# Patient Record
Sex: Female | Born: 1985 | Race: Black or African American | Hispanic: No | Marital: Single | Smoking: Current some day smoker
Health system: Southern US, Community
[De-identification: ages and names within clinical notes are randomized; demographics above are authoritative.]

---

## 2018-08-18 DIAGNOSIS — R0789 Other chest pain: Secondary | ICD-10-CM | POA: Insufficient documentation

## 2019-03-30 DIAGNOSIS — D219 Benign neoplasm of connective and other soft tissue, unspecified: Secondary | ICD-10-CM | POA: Insufficient documentation

## 2019-04-08 DIAGNOSIS — M549 Dorsalgia, unspecified: Secondary | ICD-10-CM | POA: Insufficient documentation

## 2019-04-08 DIAGNOSIS — Z5321 Procedure and treatment not carried out due to patient leaving prior to being seen by health care provider: Secondary | ICD-10-CM | POA: Insufficient documentation

## 2019-04-08 NOTE — ED Triage Notes (Signed)
Patient with complaint of lower back pain times 2 weeks ago. Patient states that she was seen at [facility] last Wednesday for the same. Patient states that she had a CT scan done at [facility] and she was told that it was muscular pain. Patient states that the pain has gotten worse.

## 2019-04-09 ENCOUNTER — Other Ambulatory Visit: Payer: Self-pay

## 2019-04-09 ENCOUNTER — Encounter: Payer: Self-pay | Admitting: Emergency Medicine

## 2019-04-09 ENCOUNTER — Emergency Department
Admission: EM | Admit: 2019-04-09 | Discharge: 2019-04-09 | Disposition: A | Discharge status: Home / Self Care | Payer: Self-pay | Attending: Emergency Medicine | Admitting: Emergency Medicine

## 2019-04-09 NOTE — ED Notes (Signed)
Unable to locate patient for update.

## 2019-04-09 NOTE — ED Notes (Signed)
Unable to locate patient to give update.

## 2019-04-19 DIAGNOSIS — M7918 Myalgia, other site: Secondary | ICD-10-CM | POA: Insufficient documentation

## 2019-04-19 DIAGNOSIS — M545 Low back pain, unspecified: Secondary | ICD-10-CM | POA: Insufficient documentation

## 2019-10-12 ENCOUNTER — Other Ambulatory Visit: Payer: Self-pay

## 2019-10-12 DIAGNOSIS — Z720 Tobacco use: Secondary | ICD-10-CM | POA: Insufficient documentation

## 2019-10-12 DIAGNOSIS — H60502 Unspecified acute noninfective otitis externa, left ear: Secondary | ICD-10-CM | POA: Insufficient documentation

## 2019-10-12 NOTE — ED Triage Notes (Signed)
Pt to the er for a right sided ear infection. Pt seen yesterday and given antibiotic drops. Pt states it is making it worse.

## 2019-10-13 ENCOUNTER — Emergency Department
Admission: EM | Admit: 2019-10-13 | Discharge: 2019-10-13 | Disposition: A | Discharge status: Home / Self Care | Payer: Self-pay | Attending: Emergency Medicine | Admitting: Emergency Medicine

## 2019-10-13 ENCOUNTER — Emergency Department: Payer: Self-pay

## 2019-10-13 DIAGNOSIS — H60502 Unspecified acute noninfective otitis externa, left ear: Secondary | ICD-10-CM

## 2019-10-13 DIAGNOSIS — H9202 Otalgia, left ear: Secondary | ICD-10-CM

## 2019-10-13 LAB — BASIC METABOLIC PANEL
Anion gap: 4 — ABNORMAL LOW (ref 5–15)
BUN: 19 mg/dL (ref 6–20)
CO2: 26 mmol/L (ref 22–32)
Calcium: 8.4 mg/dL — ABNORMAL LOW (ref 8.9–10.3)
Chloride: 108 mmol/L (ref 98–111)
Creatinine, Ser: 0.78 mg/dL (ref 0.44–1.00)
GFR calc Af Amer: >60 mL/min (ref >60)
GFR calc non Af Amer: >60 mL/min (ref >60)
Glucose, Bld: 95 mg/dL (ref 70–99)
Potassium: 3.8 mmol/L (ref 3.5–5.1)
Sodium: 138 mmol/L (ref 135–145)

## 2019-10-13 LAB — CBC WITH DIFFERENTIAL/PLATELET
Abs Immature Granulocytes: 0.03 10*3/uL (ref 0.00–0.07)
Basophils Absolute: 0.1 10*3/uL (ref 0.0–0.1)
Basophils Relative: 1 %
Eosinophils Absolute: 0.2 10*3/uL (ref 0.0–0.5)
Eosinophils Relative: 2 %
HCT: 40.1 % (ref 36.0–46.0)
Hemoglobin: 13.2 g/dL (ref 12.0–15.0)
Immature Granulocytes: 0 %
Lymphocytes Relative: 28 %
Lymphs Abs: 2.9 10*3/uL (ref 0.7–4.0)
MCH: 30.1 pg (ref 26.0–34.0)
MCHC: 32.9 g/dL (ref 30.0–36.0)
MCV: 91.3 fL (ref 80.0–100.0)
Monocytes Absolute: 0.7 10*3/uL (ref 0.1–1.0)
Monocytes Relative: 7 %
Neutro Abs: 6.7 10*3/uL (ref 1.7–7.7)
Neutrophils Relative %: 62 %
Platelets: 228 10*3/uL (ref 150–400)
RBC: 4.39 MIL/uL (ref 3.87–5.11)
RDW: 13.1 % (ref 11.5–15.5)
WBC: 10.6 10*3/uL — ABNORMAL HIGH (ref 4.0–10.5)
nRBC: 0 % (ref 0.0–0.2)

## 2019-10-13 LAB — SEDIMENTATION RATE: Sed Rate: 29 mm/hr — ABNORMAL HIGH (ref 0–20)

## 2019-10-13 MED ORDER — MORPHINE SULFATE (PF) 4 MG/ML IV SOLN
4.0000 mg | Freq: Once | INTRAVENOUS | Status: AC
  Administered 2019-10-13: 4 mg via INTRAVENOUS
  Filled 2019-10-13: qty 1

## 2019-10-13 MED ORDER — ONDANSETRON HCL 4 MG/2ML IJ SOLN
4.0000 mg | INTRAMUSCULAR | Status: AC
  Administered 2019-10-13: 05:00:00 4 mg via INTRAVENOUS
  Filled 2019-10-13: qty 2

## 2019-10-13 MED ORDER — IOHEXOL 300 MG/ML  SOLN
75.0000 mL | Freq: Once | INTRAMUSCULAR | Status: AC | PRN
  Administered 2019-10-13: 06:00:00 75 mL via INTRAVENOUS

## 2019-10-13 MED ORDER — OXYCODONE-ACETAMINOPHEN 5-325 MG PO TABS
2.0000 | ORAL_TABLET | Freq: Once | ORAL | Status: DC
  Filled 2019-10-13: qty 2

## 2019-10-13 MED ORDER — OXYCODONE-ACETAMINOPHEN 5-325 MG PO TABS: 2.0000 | ORAL_TABLET | Freq: Four times a day (QID) | ORAL | 0 refills | Status: AC | PRN

## 2019-10-13 MED ORDER — AMOXICILLIN-POT CLAVULANATE 875-125 MG PO TABS
1.0000 | ORAL_TABLET | Freq: Once | ORAL | Status: AC
  Administered 2019-10-13: 07:00:00 1 via ORAL
  Filled 2019-10-13: qty 1

## 2019-10-13 MED ORDER — AMOXICILLIN-POT CLAVULANATE 875-125 MG PO TABS: 1.0000 | ORAL_TABLET | Freq: Two times a day (BID) | ORAL | 0 refills | Status: AC

## 2019-10-13 NOTE — ED Notes (Signed)
MD made aware of pt's continued pain

## 2019-10-13 NOTE — ED Notes (Signed)
Pt transported to Ct.

## 2019-10-13 NOTE — ED Provider Notes (Signed)
[facility] Emergency Department Provider Note  ____________________________________________   First MD Initiated Contact with Patient 10/13/19 [phone]     (approximate)  I have reviewed the triage vital signs and the nursing notes.   HISTORY  Chief Complaint Otalgia    HPI Donna Barker is a 34 y.o. female who reports no contributory past medical history and presents for evaluation of worsening left ear pain and pain surrounding the ear.  She said the symptoms started about 3 days ago.  She went to [facility] and was diagnosed with otitis externa and prescribed eardrops.  She says that the pain is only gotten worse since the eardrops and it is spread out behind the ear.  The pain is sharp and radiating in and around to behind her ear and nothing in particular makes it better or worse.  It does not seem to be draining any fluid.  She denies fever/chills, sore throat, chest pain, cough, shortness of breath, nausea, vomiting, and abdominal pain.  She cleans her ears regularly but does not remember any traumatic episode.  She has not been swimming or had any other unusual object or substance and her left ear, at least until using the prescribed eardrops.   She does not historically have issues with her ears and has not been to see a specialist in the past.  She does not have diabetes.        History reviewed. No pertinent past medical history.  There are no problems to display for this patient.   History reviewed. No pertinent surgical history.  Prior to Admission medications   Medication Sig Start Date End Date Taking? Authorizing Provider  amoxicillin-clavulanate (AUGMENTIN) 875-125 MG tablet Take 1 tablet by mouth every 12 (twelve) hours for 10 days. 10/13/19 10/23/19  Hinda Kehr, MD  oxyCODONE-acetaminophen (PERCOCET) 5-325 MG tablet Take 2 tablets by mouth every 6 (six) hours as needed for severe pain. 10/13/19   Hinda Kehr, MD     Allergies Patient has no known allergies.  No family history on file.  Social History Social History   Tobacco Use  . Smoking status: Current Some Day Smoker  . Smokeless tobacco: Never Used  Substance Use Topics  . Alcohol use: Never  . Drug use: Never    Review of Systems Constitutional: No fever/chills Eyes: No visual changes. ENT: Severe left ear pain, worsening and extending beyond the ear. Cardiovascular: Denies chest pain. Respiratory: Denies shortness of breath. Gastrointestinal: No abdominal pain.  No nausea, no vomiting.  No diarrhea.  No constipation. Genitourinary: Negative for dysuria. Musculoskeletal: Negative for neck pain.  Negative for back pain. Integumentary: Negative for rash. Neurological: Negative for headaches, focal weakness or numbness.   ____________________________________________   PHYSICAL EXAM:  VITAL SIGNS: ED Triage Vitals  Enc Vitals Group     BP 10/12/19 2338 122/76     Pulse Rate 10/12/19 2338 83     Resp 10/12/19 2338 18     Temp 10/12/19 2338 98.2 F (36.8 C)     Temp Source 10/12/19 2338 Oral     SpO2 10/12/19 2338 98 %     Weight 10/12/19 2339 95.3 kg (210 lb)     Height 10/12/19 2339 1.6 m (5\' 3" )     Head Circumference --      Peak Flow --      Pain Score 10/12/19 2339 10     Pain Loc --      Pain Edu? --  Excl. in [place]? --     Constitutional: Alert and oriented.  Appears very uncomfortable and to be in pain. Eyes: Conjunctivae are normal.  Head: Atraumatic. Ears:  Healthy appearing right ear including the external ear, ear canal, and tympanic membrane.  The left ear canal is erythematous and swollen although not closed off.  There is debris present in the bottom of the ear canal and the tympanic membrane has a gray appearance but with no obvious effusion behind the TM.  There is no perforation.  There is severe tenderness to manipulation of the external ear and the patient has tenderness to palpation of the  mastoid and the skull behind and around the ear.  There is no obvious induration, fluctuance, nor erythema around the ear and no sign of infection of the external ear. Nose: No congestion/rhinnorhea. Mouth/Throat: Patient is wearing a mask. Neck: No stridor.  No meningeal signs.   Cardiovascular: Normal rate, regular rhythm. Good peripheral circulation. Grossly normal heart sounds. Respiratory: Normal respiratory effort.  No retractions. Gastrointestinal: Soft and nontender. No distention.  Musculoskeletal: No lower extremity tenderness nor edema. No gross deformities of extremities. Neurologic:  Normal speech and language. No gross focal neurologic deficits are appreciated.  Skin:  Skin is warm, dry and intact. Psychiatric: Mood and affect are normal. Speech and behavior are normal.  ____________________________________________   LABS (all labs ordered are listed, but only abnormal results are displayed)  Labs Reviewed  CBC WITH DIFFERENTIAL/PLATELET - Abnormal; Notable for the following components:      Result Value   WBC 10.6 (*)    All other components within normal limits  BASIC METABOLIC PANEL - Abnormal; Notable for the following components:   Calcium 8.4 (*)    Anion gap 4 (*)    All other components within normal limits  SEDIMENTATION RATE - Abnormal; Notable for the following components:   Sed Rate 29 (*)    All other components within normal limits   ____________________________________________  EKG  No indication for EKG ____________________________________________  RADIOLOGY I, Hinda Kehr, personally viewed and evaluated these images (plain radiographs) as part of my medical decision making, as well as reviewing the written report by the radiologist.  I also discussed the case by phone with the radiologist, Dr. Pascal Lux.  ED MD interpretation: No indication of acute abnormality on CT temporal bone with IV contrast  Official radiology report(s): CT Temporal Bones  W Contrast  Result Date: 10/13/2019 CLINICAL DATA:  Tightest externa with worsening pain not responsive to drops. Mastoid tenderness. EXAM: CT TEMPORAL BONES WITH CONTRAST TECHNIQUE: Axial and coronal plane CT imaging of the petrous temporal bones was performed with thin-collimation image reconstruction after intravenous contrast administration. Multiplanar CT image reconstructions were also generated. CONTRAST:  13mL OMNIPAQUE IOHEXOL 300 MG/ML  SOLN COMPARISON:  None. FINDINGS: Patient's otitis externa is largely occult by CT. There is no osteomyelitis or collection. The mastoid and middle ear spaces are clear. Symmetric normal labyrinthine morphology and density. Intact ossicular chains. Visualized intracranial structures are unremarkable. There is an incidental aplastic right A1 segment. Dural sinuses are normally enhancing where covered. IMPRESSION: Negative temporal bone study. No otitis media or mastoiditis. No malignant otitis externa. Electronically Signed   By: Monte Fantasia M.D.   On: 10/13/2019 07:00    ____________________________________________   PROCEDURES   Procedure(s) performed (including Critical Care):  Procedures   ____________________________________________   INITIAL IMPRESSION / MDM / [place] / ED COURSE  As part of my medical  decision making, I reviewed the following data within the [place] notes reviewed and incorporated, Labs reviewed , Old chart reviewed, Discussed with radiologist and Notes from prior ED visits and reviewed  controlled substance database report   Differential diagnosis includes, but is not limited to, otitis externa, malignant otitis externa, mastoiditis, otitis media.  Her symptoms seem to be confined to the left ear and the area around the ear but I am concerned that she is worsening in spite of using Cortisporin otic suspension which should be helping with the inflammation and pain.  Her left ear is  not closed office can sometimes happen with malignant otitis externa but I am more concerned about the otalgia and tenderness with manipulation of the external ear this seems out of proportion with the physical exam.  Additionally she has tenderness to palpation of the mastoid process and the skull behind the ear and I think it is important to rule out spreading infection to the skull.  I discussed the case with the radiologist Dr. Pascal Lux to determine the appropriate imaging modality.  He recommended CT temporal bones with IV contrast.  Lab work is pending and the study is pending.  I have ordered morphine 4 mg IV and Zofran 4 mg IV in the meantime.  No evidence of systemic infection/sepsis.      Clinical Course as of Oct 12 708  Fri Oct 13, 2019  0603 Reassuring CBC  CBC with Differential/Platelet(!) [CF]    Clinical Course User Index [CF] Hinda Kehr, MD     ____________________________________________  FINAL CLINICAL IMPRESSION(S) / ED DIAGNOSES  Final diagnoses:  Acute otitis externa of left ear, unspecified type  Otalgia of left ear     MEDICATIONS GIVEN DURING THIS VISIT:  Medications  amoxicillin-clavulanate (AUGMENTIN) 875-125 MG per tablet 1 tablet (has no administration in time range)  oxyCODONE-acetaminophen (PERCOCET/ROXICET) 5-325 MG per tablet 2 tablet (has no administration in time range)  morphine 4 MG/ML injection 4 mg (4 mg Intravenous Given 10/13/19 0520)  ondansetron (ZOFRAN) injection 4 mg (4 mg Intravenous Given 10/13/19 0520)  iohexol (OMNIPAQUE) 300 MG/ML solution 75 mL (75 mLs Intravenous Contrast Given 10/13/19 0626)     ED Discharge Orders         Ordered    amoxicillin-clavulanate (AUGMENTIN) 875-125 MG tablet  Every 12 hours     10/13/19 0707    oxyCODONE-acetaminophen (PERCOCET) 5-325 MG tablet  Every 6 hours PRN     10/13/19 [id number]          *Please note:  Rubbie Lapointe was evaluated in Emergency Department on 10/13/2019 for the symptoms  described in the history of present illness. She was evaluated in the context of the global COVID-19 pandemic, which necessitated consideration that the patient might be at risk for infection with the SARS-CoV-2 virus that causes COVID-19. Institutional protocols and algorithms that pertain to the evaluation of patients at risk for COVID-19 are in a state of rapid change based on information released by regulatory bodies including the CDC and federal and state organizations. These policies and algorithms were followed during the patient's care in the ED.  Some ED evaluations and interventions may be delayed as a result of limited staffing during the pandemic.*  Note:  This document was prepared using Dragon voice recognition software and may include unintentional dictation errors.   Hinda Kehr, MD 10/13/19 [phone]

## 2019-10-13 NOTE — ED Notes (Signed)

## 2019-10-13 NOTE — Discharge Instructions (Signed)
As we discussed, your lab work and CT scan of your ear and surrounding area was reassuring.  Please continue to use the drops you were prescribed.  Please also use the antibiotics I prescribed and take the full course of treatment for the next 10 days.  Use over-the-counter ibuprofen and Tylenol as needed according to label instructions. Take Percocet as prescribed for severe pain. Do not drink alcohol, drive or participate in any other potentially dangerous activities while taking this medication as it may make you sleepy. Do not take this medication with any other sedating medications, either prescription or over-the-counter. If you were prescribed Percocet or Vicodin, do not take these with acetaminophen (Tylenol) as it is already contained within these medications.   This medication is an opiate (or narcotic) pain medication and can be habit forming.  Use it as little as possible to achieve adequate pain control.  Do not use or use it with extreme caution if you have a history of opiate abuse or dependence.  If you are on a pain contract with your primary care doctor or a pain specialist, be sure to let them know you were prescribed this medication today from the [facility] Emergency Department.  This medication is intended for your use only - do not give any to anyone else and keep it in a secure place where nobody else, especially children, have access to it.  It will also cause or worsen constipation, so you may want to consider taking an over-the-counter stool softener while you are taking this medication.  Please call the ENT office and schedule a follow-up appointment with Dr. Richardson Landry or one of his colleagues within the next week to make sure your ear is improving.

## 2019-11-27 ENCOUNTER — Other Ambulatory Visit: Payer: Self-pay | Admitting: *Deleted

## 2019-11-27 NOTE — Patient Outreach (Signed)
  [facility]) Care Management  11/27/2019  Devone Duggar Oct 22, 1985 [id number]   [facility] outreach for [place] referred patient  Referral date 11/24/19 Referral source [facility] insurance plan  Referral reason  Please see the below information and outreach member for [place].  6ED visits in 60 day  10/31/19 [facility] cellulitis and abscess 10/28/19 [facility] Insect bite of right thigh 10/14/19 [facility]  Acute serous otitis media 10/13/19 [facility] ED  Acute serous otitis media 10/11/19 [facility]  Acute serous otitis media 08/21/19 [facility] mid back pain on left   Outreach attempt # 1  No answer. [facility] RN CM left HIPAA [facility] Portability and Accountability Act) compliant voicemail message along with CM's contact info.   Plan: [facility] RN CM sent an unsuccessful outreach letter and scheduled this patient for another call attempt within 4 business days   Anjannette Gauger L. Lavina Hamman, RN, BSN, [place] Management Care Coordinator Direct Number [phone] Mobile number [phone]  Main [facility] number [phone] Fax number [phone]

## 2019-11-27 NOTE — Patient Outreach (Addendum)
[facility]) Care Management  11/27/2019  Donna Barker 02-May-1986 [id number]   [facility] outreach for Donna Barker referred patient  Referral date 11/24/19 Referral source [facility] insurance plan  Referral reason  Please see the below information and outreach member for Donna Barker.  5 ED visits in 60 days  6 ED visits in the last 6 months 10/31/19 [facility] cellulitis and abscess 10/28/19 [facility] Insect bite of right thigh 10/14/19 [place] ED  Acute serous otitis media 10/13/19 [facility] ED  Acute serous otitis media 10/11/19 [facility]  Acute serous otitis media 08/21/19 [facility] mid back pain on left   Patient returned a call to [facility] RN CM  Patient is able to verify HIPAA ([name]) identifiers, date of birth (DOB) and address Reviewed and addressed referral to [facility] with patient  Referral for increased ED visits   Donna Barker confirms increased ED visits She reports she did establish care with primary care provider (PCP) at [facility] (Dr Donna Barker with the first initial consult on Donna Barker for folliculitis, acute vaginitis  She confirms having an abscess that was lanced  She was on antibiotics (bactrim, diflucan)  but noted increased itching and burning of the vagina She reports stopping the antibiotics on her own as the symptoms increased. Counseled on discontinuation of antibiotic prior to completing entire dose. Discuss taking antibiotics with food like yogurt, milk etc to prevent Gastrointestinal (GI) symptoms    She report she is still having some ear problems after a MD "sucked out my ear" because it was not draining She reports some on going itching and burning of her left ear. She confirms with [facility] RN CM inquired that she was previously on allergy medication as a "child" [facility] RN CM discussed over the counter (OTC)  medicines like Flonase, Claritin, zyrtec, etc  Social Donna Barker is a 34 year old female who has support of her mother, Donna Barker, and her friend, Donna Barker. She lives with her life partner and 2 step children. Her job is listed as detailing cars. High school diploma/GED  Conditions cellulitis and abscess, lanced abscess under right arm and right upper leg/thigh with residual several painful raised spots that have been draining, Insect bite, acute serous otitis media externa left ear, Mid back pain on left, history of urinary tract infection (UTI), constipation, history of dysmenorrhea and uterine fibroid, LMP 10/28/19 (2-3 menstrual cycles per month since she was a teenager), myofascial pain,  history of yeast infections,strep throat infections with enlarged tonsils, frequent ear infections, hx of chronic bronchitis, hx of facial swelling from bee stings carries Epipen. Current smoker (cigars, cigarettes)-0.25 packs per day  Appointments  11/30/19 office visit with pcp Dr Donna Barker   Plan: [facility] RN CM will follow up with Donna Barker within then next 4-7  business days for further assessment for care coordination and educational needs  TH RN CM provided her with the 24 hour [facility] nurse call center number  Pt encouraged to return a call to [facility] RN CM prn Routed note to MD Sent MD involvement barriers letter and pt welcome letter  [facility] CM Care Plan Problem One     Most Recent Value  Care Plan Problem One  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 Discuss taking antibiotics with food like yougart, milk etc to prevent Gi symptoms Answered questions  [facility] CM Short Term Goal #1   over then next 7 days patient will follow up with primary care provider for further assessment and treatment plans as confirmed with outreach to patient  [facility] CM Short Term Goal #1 Start Date  11/27/19  Interventions for Short Term Goal #1  Discussed MD visits, follow home care answered questions, encouraged her to discuss discontinuation of antibiotics with MD and reason for discontinuation. discussed OTC allergy medications      Donna Barker L. Lavina Hamman, RN, BSN, [place] Management Care Coordinator Direct Number [phone] Mobile number [phone]  Main [facility] number [phone] Fax number [phone]

## 2019-11-28 ENCOUNTER — Encounter: Payer: Self-pay | Admitting: *Deleted

## 2019-11-28 ENCOUNTER — Ambulatory Visit: Payer: Self-pay | Admitting: *Deleted

## 2019-11-30 ENCOUNTER — Other Ambulatory Visit: Payer: Self-pay | Admitting: *Deleted

## 2019-11-30 NOTE — Patient Outreach (Signed)
[employer] [facility] [facility]) Care Management  11/30/2019  Io Donnel 1986/03/30 [id number]   [facility] outreach for [place] referred patient  Referral date 11/24/19 Referral [facility] insurance plan  Referral reason Please see the below information and outreach member for [place].  5 ED visits in 60 days  6 ED visits in the last 6 months 10/31/19 [facility] cellulitis and abscess 10/28/19 [facility] Insect bite of right thigh 10/14/19 [place] ED Acute serous otitis media 10/13/19 [facility] ED Acute serous otitis media 10/11/19 [facility] Acute serous otitis media 08/21/19 [facility] mid back pain on left   Outreach attempt # 2 No answer. [facility] RN CM left HIPAA [facility] Portability and Accountability Act) compliant voicemail message along with CM's contact info.   Informed Ms Jaquith that [facility] RN CM would reach out to her within the next 4 business days also related to concerning inclement weather - Tornado warnings  Encouraged pt to take inclement weather safety precautions  Plan: [facility] RN CM scheduled this [facility] engaged patient for another call attempt within 4 business days   Kimberly L. Lavina Hamman, RN, BSN, [place] Coordinator Office number [phone] Mobile number [phone]  Main [facility] number [phone] Fax number [phone]

## 2019-12-06 ENCOUNTER — Other Ambulatory Visit: Payer: Self-pay

## 2019-12-06 ENCOUNTER — Other Ambulatory Visit: Payer: Self-pay | Admitting: *Deleted

## 2019-12-06 NOTE — Patient Outreach (Signed)
Donna Barker) Care Management  12/06/2019  Donna Barker 1986/05/11 [id number]   [facility] outreach for Donna Barker referred patient  Referral date 11/24/19 [facility] Active on 11/27/19 Referral sourceBright Health insurance plan  Referral reasonPlease see the below information and outreach member for Donna Barker.    5 ED visits in 60 days 6 EDvisits in the last 6 months 10/31/19 [facility] cellulitis and abscess 10/28/19 [facility] Insect bite of right thigh 10/14/19 [place] ED left Acute serous otitis media 10/13/19 [facility] ED left Acute serous otitis media 10/11/19 [facility] left Acute serous otitis media 08/21/19 [facility] mid back pain on left   Outreach attempt # 3 successful  Patient is able to verify HIPAA (Donna Barker and Donna Barker) identifiers, date of birth (DOB) and address Reviewed follow up reason discussed   Follow up primary care provider (PCP) 12/04/19 visit Confirmed she did attend her 12/04/19 appointment and reports she was informed she was doing better. She reports the abscess was reported to be healing,  She states she is being sent to about her tingling fingertips of her hands and has not heard from anyone yet. She was given a prescription for valium that helped Sent EMMI on hand numbness   Further upcoming treatment She also reports she is to get and ultrasound of her neck/throat She has not obtained over the counter allergy medicines as discussed previously Donna Barker discuss the over the counter medicines can be purchased without a MD order. She confirms having mucus in her nose and throat. She has used Flonase previously She was sent an EMMI for seasonal allergies in adults  urinary tract infection (UTI) Assessed for UTI family history (hx). She reports her mother and cousin have a hx of UTIs. Discussed increase of fluids to assist with UTI and constipation Sent EMMI  materials for adult urinary tract infection discharge instructions   Constipation States Dr Donna Barker encourage Miralax every day  She reports colace does not work and various medicines has been used without success to include Golytely type products.  She has had success with dulcolax  Assessed for foods she likes, encouraged increase intake of the fruits and vegetables she likes, encourage intake of 3 bottles of fluids a day  She reports she is a meat eater She reports she has never liked to drink plan water even as a child She an [facility] RN Barker discussed alternatives to plan water to include crystal light no sodium low calorie flavoring. Discussed her Miralax could be put in this to make it more palpable. She reports a long hx of soda intake. She reports she would prefer to try crystal light versus teas She likes only hot chocolate for warm drinks Discuss how warm fluids soften stools to decrease constipation She voiced understanding   She confirms Dr Donna Barker discussed a high fiber diet She reports liking various fruits to include strawberries, bananas, peaches, watermelons She reports she likes vegetables like greens and cabbage  Donna Barker was sent EMMI materials for high fiber diet, dealing with constipation from the drugs you take, adult constipation discharge instructions  She reports she has not had a colonoscopy  She and Donna [facility] RN Barker discussed th purpose of colonoscopies, removal of polyps etc She was sent EMMI material for colonoscopy  Social Donna Donna Barker is a 34 year old female who has support of her mother, Donna Barker, and her friend, Donna Barker. She lives with her life  partner and 2 step children. Her job is listed as detailing cars. High school diploma/GED She report does not cook a lot like her mother and younger sister   Conditions cellulitis and abscess, lanced abscess under right arm and right upper leg/thigh with residual several painful raised spots that have been draining,  Insect bite, acute serous otitis media externa left ear, Mid back pain on left, history of urinary tract infection (UTI), constipation, history of dysmenorrhea and uterine fibroid, LMP 10/28/19 (2-3 menstrual cycles per month since she was a teenager), myofascial pain,  history of yeast infections,strep throat infections with enlarged tonsils, frequent ear infections, hx of chronic bronchitis, hx of facial swelling from bee stings carries Epipen. 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"   Appointments  02/06/20 Dr Donna Barker [facility]  Pending Ultrasound for left ear pain ENT and  Pending lab for hand numbness for burning  Painful hands works vacuuming cars   Plan: Donna Barker scheduled this [facility] engaged patient for another call attempt within 4 business days Pt encouraged to return a call to [facility] RN Barker prn Routed note to MD   [place] Problem One     Most Recent Value  Care Plan Problem One  Knowledge deficit of home care for recurrent infections  Role Documenting the Problem One  Care Management Telephonic Coordinator  Care Plan for Problem One  Active  THN Long Term Goal   over the next 60 days days patient will be able to verbalize with outreach interventions to manage recurrent infections at home  [place] Start Date  11/27/19  Interventions for Problem One Long Term Goal  assessed for worsening symptoms, discussed recurrent chronic constipation, discussed overgrowth of normal flora, sent EMMI education  [facility] Short Term Goal #1   over then next 7 days patient will follow up with primary care provider for further assessment and treatment plans as confirmed with outreach to patient  [facility] Short Term Goal #1 Start Date  11/27/19  [facility] Short Term Goal #1 Met Date  12/06/19  [facility]N Barker Short Term Goal #2   over the next 30 days patient will have ultrasound of  her neck  and labs completed to assist with diagnosising hand nd neck concerns per update in future outreach contact  [facility] Short Term Goal #2 Start Date  12/06/19  Interventions for Short Term Goal #2  assed present medical concerns, upcoming tests and labls encouraged to call to schedule appointment for lab and ultrasound sent EMMI eduation  [facility]N [facility] Short Term Goal #3  over the next 45 day will report improvement in intake of fiber, fruits, vegetables and fluids as evidenced by improved bowel elimination during futre outreaches  [facility]N Barker Short Term Goal #3 Start Date  12/06/19  Interventions for Short Tern Goal #3  assessed for foods she likes, encouraged increase intake of the fruits and vegetables she likes, encourage intake of 3 bottles of fluids a day       Sue Fernicola L. Lavina Hamman, RN, BSN, [place] Coordinator Office number [phone] Mobile number [phone]  Main THN number [phone] Fax number [phone]

## 2019-12-07 ENCOUNTER — Encounter: Payer: Self-pay | Admitting: *Deleted

## 2019-12-07 ENCOUNTER — Other Ambulatory Visit: Payer: Self-pay | Admitting: *Deleted

## 2019-12-07 DIAGNOSIS — N390 Urinary tract infection, site not specified: Secondary | ICD-10-CM | POA: Insufficient documentation

## 2019-12-07 DIAGNOSIS — K5900 Constipation, unspecified: Secondary | ICD-10-CM | POA: Insufficient documentation

## 2019-12-07 NOTE — Patient Outreach (Signed)
[place] [facility]) Care Management  12/07/2019  Donna Barker 1986-03-07 [id number]   [place] coordination [facility] multidisciplinary care discussion  [name] was reviewed during [facility]  Recommendations  Send contact information to [place]arma [place], [place] representative to forward [place] resources to assist with cost of medicines to updated address Continue review of medical concerns with disease management and education Continue Bright health insurance education prn    Plan [facility] RN CM sent patient demographics to [place] to be sentto [facility] RN CM will follow up with [name] within the next 7-10 business days   [place] L. Lavina Hamman, RN, BSN, [place] Coordinator Office number [phone] Mobile number [phone]  Main THN number [phone] Fax number [phone]

## 2019-12-12 ENCOUNTER — Other Ambulatory Visit: Payer: Self-pay | Admitting: *Deleted

## 2019-12-12 ENCOUNTER — Other Ambulatory Visit: Payer: Self-pay

## 2019-12-12 NOTE — Patient Outreach (Signed)
Donna Barker, Barker) Care Management  12/12/2019  Donna Barker 1986/05/17 [id number]   Don[facility] outreach for Donna Barker referred patient  Referral date 11/24/19 Donna Barker Active on 11/27/19 Referral sourceBright Barker insurance plan  Referral reasonPlease see the below information and outreach member for Donna Barker.   5 Barker visits in 60 days 6 EDvisits in the last 6 months 10/31/19 Donna Barker cellulitis and abscess 10/28/19 Donna Barker Insect bite of right thigh 10/14/19 Donna Barker left Acute serous otitis media 10/13/19 [facility] [place] [facility] left Acute serous otitis media 10/11/19 Donna Barker left Acute serous otitis media 08/21/19 Donna Barker mid back pain on left  Outreach attempt #4 successful to home/mobile number  Patient is able to verify HIPAA (Donna Barker and Donna Barker) identifiers, date of birth (DOB) and address Reviewed follow up reason discussed   Follow up  Since the last outreach on 12/06/19 Donna Barker has been seen on 12/08/19 by Dr Cathlean Sauer Otolaryngology, (ENT) for further symptoms (itching, burning, pain when touched, Swelling, difficulty sleeping, some headaches, fullness of eardrum and increase white cloudy foreign material in there ear) with her left ear, GERD (gastroesophageal reflux disease), "lump in my Throat", left temporomandibular joint and a thyroid nodule She confirms she had chronic ear infections as a child She reports starting on omeprazole for GERD but was not able to obtain her Fluocinolone acetonide (for 7 days) as her pharmacy "had to order it"  She reports use of [facility]en[facility] or Donna Barker pharmacies She report continued pain and called to request to see another ENT, Dr Tasia Catchings, on 12/13/19  She reports when assessed she is aware that she had "outer and inner ear infection"  She confirms she has never liked to get water in her eyes or ears and has been very careful in the last few months She  reports her leg abscess has resolved but still at intervals had symptoms from her axilla area but manageable  [facility] RN CM discussed with her the [facility] multidisciplinary care discussion and [place] [facility] to assist with providing her with a medication resource. She has not had an outreach at this time.   Medication resource After speaking with her [facility] RN CM outreached to Donna Barker who states Donna Barker did not have access to the benefit and will be contacted via telephone to sign up for the Donna Barker member hub to get access to her reward program, Donna Barker She can call the member service number and have a staff also sigh her up. It was noted also by Donna Barker that most of her medicines averaged $5 except one so far. Donna Barker recalled she was informed that one of her ear drops cost was "three hundred dollars but he gave me the bottled used in the Barker to take with me"   She confirms receiving return calls to discuss treatment for her hand numbness THN RN CM answered questions for her about nerve conduction testing and electromyography (EMG) She reports her mother had surgery for carpal tunnel and discussed her experience with her  She was sent education information on nerve conduction testing and (EMG) MRSA/infections THN RN CM assessed her knowledge of MRSA and answered questions She confirms her deceased brother had concerns with MRSA She discussed being diagnosed with MRSA after "two" consecutive Barker visits to "Donna" [facility] RN CM assessed her for possible sources of infection. 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 She was  reminded about the Donna Barker medication resource to be offered to her  She also confirms she is a current smoker ("newports", Not ready for smoke cessation at this time)  Donna Barker information sent to her via e-mail about MRSA today  Assessed for management of constipation to include fluid intake. She is having better stools she reports as a result of her medicines but still needs to improve on the intake of her fluids As of the outreach to her she reports an intake of only one bottle of fluids today as she has been busy working most of the day. THN RN CM provided encouragement to increase the fluid intake along with her medicines. She voiced understanding. THN RN CM reviewed the Donna Barker materials sent to her to include information on constipation.   Social Donna Barker is a 34 year old female who has support of her mother, Donna Barker, and herfriend, Donna Barker. She lives with her life partner and 2 step children. Her job islisted asdetailing cars. High school diploma/GED She report does not cook a lot like her mother and younger sister She has a deceased brother    Conditionscellulitis and abscess, lanced abscess under right arm and right upper leg/thigh with residual several painful raised spots that have been draining, Insect bite,acute serous otitis media externa left ear, Mid back pain on left, history of urinary tract infection (UTI), constipation, history of dysmenorrhea and uterine fibroid, LMP 10/28/19 (2-3 menstrual cycles per month since she was a teenager), myofascial pain, history of yeast infections,strep throat infections with enlarged tonsils, frequent ear infections, hx of chronic bronchitis, hx of facial swelling from bee stings carries Epipen. 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"   Upcoming appointments  01/02/20 radiology for hand numbness 01/11/20 Dr Onnie Graham-  interpretation 02/06/20 Dr Onnie Graham   [facility] RN CM will follow up with Donna Barker within the next 7-10 business days  for further education , post acute Barker assessment, disease management and complex care management Pt encouraged to return a call to [facility] RN CM prn Routed note to MD  [facility] CM Care Plan Problem One     Most Recent Value  Care Plan Problem One  Knowledge deficit of home care for recurrent infections  Role Documenting the Problem One  Care Management Telephonic Coordinator  Care Plan for Problem One  Active  [facility] Long Term Goal   over the next 60 days days patient will be able to verbalize with outreach interventions to manage recurrent infections at home  [place] Start Date  11/27/19  Interventions for Problem One Long Term Goal  assessed for worsening symptoms answered questions, sent Donna Barker and education information, encouraged discussing epipen with MD   [facility] CM Short Term Goal #1   over then next 7 days patient will follow up with primary care provider for further assessment and treatment plans as confirmed with outreach to patient  [facility] CM Short Term Goal #1 Start Date  11/27/19  [facility] CM Short Term Goal #1 Met  Date  12/06/19  Donna Barker CM Short Term Goal #2   over the next 30 days patient will have ultrasound of her neck  and labs completed to assist with diagnosising hand nd neck concerns per update in future outreach contact  [facility] CM Short Term Goal #2 Start Date  12/06/19  Interventions for Short Term Goal #2  assessed for labs, testing and future imagingAnswered questions and sent educational information   Donna Barker CM Short Term Goal #3  over the next 45 day will report improvement in intake of fiber, fruits, vegetables and fluids as evidenced by improved bowel elimination during futre outreaches  Donna Barker CM Short Term Goal #3 Start Date  12/06/19  Interventions for Short Tern Goal #3  assessed for home management of constipation and dietary changes provided encouragement        Katrice Goel L. Lavina Hamman, RN, BSN, [place] Coordinator Office number [phone] Mobile number [phone]  Main Donna Barker number [phone] Fax number [phone]

## 2019-12-14 ENCOUNTER — Other Ambulatory Visit: Payer: Self-pay | Admitting: *Deleted

## 2019-12-14 NOTE — Patient Outreach (Signed)
[place] [facility]) Care Management  12/14/2019  Donna Barker November 07, 1985 [id number]   [place] program  [facility] RN CM collaborated with [place] [place] RN CM sent Donna Barker the [facility] health rewards flyer via mail, encouraged her to sign up for the program online or via member services number   [facility] RN CM will follow up with Donna Barker within the next 7-10 business days  for further education , post acute ED assessment, disease management and complex care management  Carlyne Keehan L. Lavina Hamman, RN, BSN, [place] Coordinator Office number [phone] Mobile number [phone]  Main THN number [phone] Fax number [phone]

## 2019-12-19 ENCOUNTER — Other Ambulatory Visit: Payer: Self-pay

## 2019-12-19 ENCOUNTER — Other Ambulatory Visit: Payer: Self-pay | Admitting: *Deleted

## 2019-12-19 NOTE — Patient Outreach (Signed)
[facility]) Care Management  12/19/2019  Donna Barker 1986/08/28 [id number]   [facility] outreach for [place] referred patient  Referral date 3/12/21THN Active on 11/27/19 Referral sourceBright Health insurance plan  Referral reasonPlease see the below information and outreach member for [place].   5 ED visits in 60 days 6 EDvisits in the last 6 months 10/[facility] cellulitis and abscess 10/28[facility]19 [facility] Insect bite of right thigh 10/14/19 [place] ED leftAcute serous otitis media 10/13/19 [facility] leftAcute serous otitis media 10/11/19 [facility] EDleftAc[facility] serous otitis media 08/21/19 [facility] mid back pain on left  Outreach attempt #5 successfulto home/mobile number  Patient is able to verify HIPAA ([place]) identifiers, date of birth (DOB) and address  Follow up  Donna Barker is having some improvement. She attended her ENT visit with Dr Tasia Catchings and was started on prednisone Her ears feel better She reports her thigh and armpit are improving but still have a knot under her arm. With more assessment she verified that she did not have tympanostomy tubes placed in her ears after her frequent ear infections. She and [facility] RN CM discussed purpose of tympanostomy tubes She was encouraged to inquire how often she should see the ENT for ear wax removal and a plan of care for her allergies  She reports for her allergies she is using Flonase at bedtime. Appropriate usage assessed She reports nasal drainage with use of her Flonase.  She is allergic to bees. THN RN CM and she discussed Epipen use She does not have an E[Epipen Sent EMMI educational material for seasonal allergies and how to use nose drops, sprays,pumps and gels to her e mail address on file   Hand pain She is anticipating her upcoming procedure to evaluate her hand pain She reports her hand  "jumps"/spasm at night. She did receive and review educational information  [place] She now has access to [employer]  Fluid, vegetable fruit intake still attempting to improve. Provided encouragement   [facility] RN CM will follow up with Donna Barker within the next 14-21 business days Pt encouraged to return a call to [facility] RN CM prn Routed note to MD  [facility] CM Care Plan Problem One     Most Recent Value  Care Plan Problem One  Knowledge deficit of home care for recurrent infections  Role Documenting the Problem One  Care Management Telephonic Coordinator  Care Plan for Problem One  Active  THN Long Term Goal   over the next 60 days days patient will be able to verbalize with outreach interventions to manage recurrent infections at home  [place] Start Date  11/27/19  Interventions for Problem One Long Term Goal  assesed for worsen symptoms, discussed ENT appointment, assess more allergy hx and home care sent EMMI information for allergies, use of nasal sprays   THN CM Short Term Goal #1   over then next 7 days patient will follow up with primary care provider for further assessment and treatment plans as confirmed with outreach to patient  [facility] CM Short Term Goal #1 Start Date  11/27/19  [facility] CM Short Term Goal #1 Met Date  12/06/19  THN CM Short Term Goal #2   over the next 30 days patient will have ultrasound of her neck  and labs completed to assist with diagnosising hand nd neck concerns per update in future outreach contact  THN CM  Short Term Goal #2 Start Date  12/06/19  Interventions for Short Term Goal #2  discussed upcoming procedure  THN CM Short Term Goal #3  over the next 45 day will report improvement in intake of fiber, fruits, vegetables and fluids as evidenced by improved bowel elimination during futre outreaches  THN CM Short Term Goal #3 Start Date  12/06/19  Interventions for Short Tern Goal #3  assessed progress with meeting goal provided encouragement        Donna Barker L. Lavina Hamman, RN, BSN, [place] Coordinator Office number [phone] Mobile number [phone]  Main THN number [phone] Fax number [phone]

## 2019-12-20 ENCOUNTER — Other Ambulatory Visit: Payer: Self-pay | Admitting: *Deleted

## 2019-12-20 NOTE — Patient Outreach (Signed)
Donna Barker [facility]) Care Management  12/20/2019  Donna Barker 15-Aug-1986 [id number]   [place] coordination [facility] RN CM spoke with Ed Blalock at the primary care provider (PCP) office [phone] to discuss that Ms [place] voiced havig a allergy to Bees without an epipen [facility] RN CM informed a message would be sent to the pcp [facility] RN CM contact information was left also     Plan [facility] RN CM will follow up with Ms G[place] within the next 14-21 business days  [place] L. Lavina Hamman, RN, BSN, [place] Coordinator Office number [phone] Mobile number [phone]  Main THN number [phone] Fax number [phone]

## 2020-01-03 ENCOUNTER — Other Ambulatory Visit: Payer: Self-pay | Admitting: *Deleted

## 2020-01-03 ENCOUNTER — Other Ambulatory Visit: Payer: Self-pay

## 2020-01-03 NOTE — Patient Outreach (Signed)
[facility]) Care Management  01/03/2020  Chinmayi Rumer 1985-12-07 [id number]   [facility] outreach for [place] referred patient  Referral date 3/12/21THN Active on 11/27/19 Referral source[employer] insurance plan  Referral reasonPlease see the below information and outreach member for [place].   5 ED visits in 60 days 6 EDvisits in the last 6 months 10/31/19 [facility] and [facility] Insect bite of right thigh 10/14/19 [place] ED leftAcute serous otitis media 10/13/19 [facility] ED leftAcute serous otitis media 10/11/19 [facility] EDleftAc[facility] ser[facility] media 08/21/19 [facility] mid back pain on left  Outreach attemptsuccessfulto home/mobile number Patient is able to verify HIPAA ([place]) identifiers, date of birth (DOB) and address  Follow up  Ms Abram is having some improvement.  She reports better fluid intake and attempts with dietary changes. She reports her mother made her some cabbage which she likes as she also states she is particular about the foods she eats. She still reports some constipation but has a history of constipation. She reports having emesis one night that continued at work the next day. She had labs completed and reports being informed of an increase in white blood cells (wbc)  She report a decrease in itching  She now has a steroid cream to assist with her ear itching She reports vaginal itching resolved  She report she is taking vitamin k and vitamin C  Plan THN RN CM will follow up with Ms Batson within the next 7-10 business days Routed note to MD Pt encouraged to return a call to [facility] RN CM prn  [facility] CM Care Plan Problem One     Most Recent Value  Care Plan Problem One  Knowledge deficit of home care for recurrent infections  Role Documenting the Problem One  Care Management Telephonic  Coordinator  Care Plan for Problem One  Active  THN Long Term Goal   over the next 60 days days patient will be able to verbalize with outreach interventions to manage recurrent infections at home  [place] Goal Start Date  11/27/19  Interventions for Problem One Long Term Goal  assessed for worsening symptoms answered questions  THN CM Short Term Goal #1   over then next 7 days patient will follow up with primary care provider for further assessment and treatment plans as confirmed with outreach to patient  [facility] CM Short Term Goal #1 Start Date  11/27/19  [facility] CM Short Term Goal #1 Met Date  12/06/19  THN CM Short Term Goal #2   over the next 30 days patient will have ultrasound of her neck  and labs completed to assist with diagnosising hand nd neck concerns per update in future outreach contact  [place] CM Short Term Goal #2 Start Date  12/06/19  Interventions for Short Term Goal #2  assesed answered questions  THN CM Short Term Goal #3  over the next 45 day will report improvement in intake of fiber, fruits, vegetables and fluids as evidenced by improved bowel elimination during futre outreaches  THN CM Short Term Goal #3 Start Date  12/06/19  Interventions for Short Tern Goal #3  assessed for worsening symptoms answered questions      Aima Mcwhirt L. Lavina Hamman, RN, BSN, [place] Coordinator Office number [phone] Mobile number [phone]  Main THN number [phone] Fax number [phone]

## 2020-01-12 ENCOUNTER — Other Ambulatory Visit: Payer: Self-pay | Admitting: *Deleted

## 2020-01-12 NOTE — Patient Outreach (Signed)
[facility]) Care Management  01/12/2020  Donna Barker 1986-05-21 [id number]   [facility] outreach for [place] referred patient  Referral date 3/12/21THN Active on 11/27/19 Referral source[employer] insurance plan  Referral reasonPlease see the below information and outreach member for [place].   5 ED visits in 60 days 6 EDvisits in the last 6 months 10/31/19 [facility] Insect bite of right thigh 10/14/19 [place] ED leftAcute serous otitis media 10/13/19 [facility] ED leftAcute serous otitis media 10/11/19 [facility] EDleftAc[facility] ser[facility] otitis media 08/21/19 [facility] mid back pain on left  Outreach successful to mobile/home number Donna Barker is able to verify HIPAA ([place]) identifiers, date of birth (DOB) and address   Follow up bilateral carpal tunnel testing/EMG  She confirms she did better during the testing procedure than she expected  [facility] RN  CM answered questions for Donna Barker after her 01/11/20 EMG that indicates 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 She lives with her life partner and 2 step children. Her job islisted asdetailing cars. High school diploma/GED She report does not cook a lot like her mother and younger sister She has a deceased brother    Conditionscellulitis and abscess, lanced abscess under right arm and right upper leg/thigh with residual several painful raised spots that have been draining, Insect bite,acute serous otitis media externa left ear, Mid back pain on left, history of urinary tract infection  (UTI), constipation, history of dysmenorrhea and uterine fibroid, LMP 10/28/19 (2-3 menstrual cycles per month since she was a teenager), myofascial pain, history of yeast infections,strep throat infections with enlarged tonsils, frequent ear infections, hx of chronic bronchitis, hx of facial swelling from bee stings carries Epipen. 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 RN CM will follow up with Donna Barker within the next 7-14 business days Pt encouraged to return a call to [facility] RN CM prn Routed note to MD  [facility] CM Care Plan Problem One     Most Recent Value  Care Plan Problem One  Knowledge deficit of home care for recurrent infections  Role Documenting the Problem One  Care Management Telephonic Coordinator  Care Plan for Problem One  Active  [facility] Long Term Goal   over the next 60 days days patient will be able to verbalize with outreach interventions to manage recurrent infections at home  [place] Start Date  11/27/19  Interventions for Problem One Long Term Goal  assessed any worsening symptoms answered questions  THN CM Short Term Goal #1   over then next 7 days patient will follow up with primary care provider for further assessment and treatment plans as confirmed with outreach to patient  [facility] CM Short Term Goal #1 Start Date  11/27/19  [facility] CM Short Term Goal #1 Met Date  12/06/19  THN CM Short Term Goal #2   over the next 30 days patient will have  ultrasound of her neck  and labs completed to assist with diagnosising hand and neck concerns per update in future outreach contact  [place] CM Short Term Goal #2 Start Date  01/12/20  [facility] CM Short Term Goal #3  over the next 45 day will report improvement in intake of fiber, fruits, vegetables and fluids as evidenced by improved  bowel elimination during futre outreaches  THN CM Short Term Goal #3 Start Date  12/06/19  Interventions for Short Tern Goal #3  assessed any worsening symptoms answered questions       Donna Somes L. Lavina Hamman, RN, BSN, [place] Coordinator Office number [phone] Mobile number [phone]  Main THN number [phone] Fax number [phone]

## 2020-01-18 ENCOUNTER — Other Ambulatory Visit: Payer: Self-pay | Admitting: *Deleted

## 2020-01-18 ENCOUNTER — Other Ambulatory Visit: Payer: Self-pay

## 2020-01-18 NOTE — Patient Outreach (Signed)
[facility] (THN) Care Management  01/18/2020  Darin Gault 10/07/1985 [id number]   [facility]N outreach for [employer] referred patient  Referral date 3/12/21THN Active on 11/27/19 Referral source[employer] insurance plan  Referral reasonPlease see the below information and outreach member for THN Care Management Services.   5 ED visits in 60 days 6 EDvisits in the last 6 months 10/31/19 [facility] Insect bite of right thigh 10/14[facility] leftAcute serous otitis media 10/13/19 [facility] ED leftAcute serous otitis media 10/11/19 [facility] EDleftAc[facility] ser[facility] otitis media 08/21/[facility] mid back pain on left  Outreach successful to mobile/home number Ms Hausner is able to verify HIPAA (Health Insurance Portability and Accountability Act) identifiers, date of birth (DOB) and address   Follow up thyroid nodule biopsy She confirms she did better than she expected  THN RN  CM answered questions for Ms Bangs after her 5/521 right neck thyroid biopsy   She clarified her e-mail address  It was updated and she was resent the EMMI materials sent previously (seasonal allergies,in adults, MRSA, high fiber diet & UTI) She was sent EMMI education on thyroid nodules  THN RN Cm answered questions about [employer] enrollment using the customer service number   She reports she needs to continue to improve on her diet and fluid intake  She inquired about her inhaler and epipen  Social Ms Virgie Puryear is a 34 year old female who has support of her mother, Francesina, and herfriend, Vicki Lee. She lives with her life partner and 2 step children. Her job islisted asdetailing cars. High school diploma/GED She report does not cook a lot like her mother and younger sister She has a deceased brother    Conditionscellulitis and abscess, lanced abscess under right arm and right upper  leg/thigh with residual several painful raised spots that have been draining, Insect bite,acute serous otitis media externa left ear, Mid back pain on left, history of urinary tract infection (UTI), constipation, history of dysmenorrhea and uterine fibroid, LMP 10/28/19 (2-3 menstrual cycles per month since she was a teenager), myofascial pain, history of yeast infections,strep throat infections with enlarged tonsils, frequent ear infections, hx of chronic bronchitis, hx of facial swelling from bee stings carries Epipen. 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"  Upcoming appointments 02/01/20 wound care 02/06/20 Dr Fink   Plans THN RN CM will follow up with Ms Poland within the next 21-28  business days Pt encouraged to return a call to THN RN CM prn Routed note to MD THN CM Care Plan Problem One     Most Recent Value  Care Plan Problem One  Knowledge deficit of home care for thyroid nodule, pain in hands  Role Documenting the Problem One  Care Management Telephonic Coordinator  Care Plan for Problem One  Active  THN Long Term Goal   over the next 60 days days patient will be able to verbalize with outreach interventions to manage recurrent infections at home  THN Long Term Goal Start Date  11/27/19  THN Long Term Goal Met Date  01/18/20  THN CM Short Term Goal #1   over then next 7 days patient will follow up with primary care provider for further assessment and treatment plans as confirmed with outreach to patient  THN CM Short Term Goal #1 Start Date  11/27/19    THN CM Short Term Goal #1 Met Date  12/06/19  THN CM Short Term Goal #2   over the next 30 days patient will have ultrasound of her neck  and labs completed to assist with diagnosising hand and neck concerns per update in future outreach contact  THN CM Short Term Goal #2 Start Date  01/12/20  Interventions for Short Term Goal  #2  answered questions for Ms Gau after her 5/521 right neck thyroid biopsysent EMMI education on thyroid nodules  THN CM Short Term Goal #3  over the next 45 day will report improvement in intake of fiber, fruits, vegetables and fluids as evidenced by improved bowel elimination during futre outreaches  THN CM Short Term Goal #3 Start Date  12/06/19  Interventions for Short Tern Goal #3  assessed for compliance with diet, encouragement provided (seasonal allergies,in adults, MRSA, high fiber diet & UTI)     Kimberly L. Gibbs, RN, BSN, CCM THN Telephonic Care Management Care Coordinator Office number [phone] Mobile number [phone]  Main THN number [phone] Fax number [phone]  

## 2020-01-31 DIAGNOSIS — G5603 Carpal tunnel syndrome, bilateral upper limbs: Secondary | ICD-10-CM | POA: Insufficient documentation

## 2020-02-06 ENCOUNTER — Other Ambulatory Visit: Payer: Self-pay | Admitting: *Deleted

## 2020-02-06 ENCOUNTER — Other Ambulatory Visit: Payer: Self-pay

## 2020-02-06 NOTE — Patient Outreach (Signed)
[facility]) Care Management  02/06/2020  Donna Barker Jun 26, 1986 [id number]   [facility] outreach for [place] referred patient  Referral date 3/12/21THN Active on 11/27/19 Referral source[employer] insurance plan  Referral reasonPlease see the below information and outreach member for [place].   5 ED visits in 60 days 6 EDvisits in the last 6 months 10/31[facility] Insect bite of right thigh 10/14/19 [place] ED leftAcute serous otitis media 10/13/19 [facility] ED leftAcute serous otitis media 10/11/19 [facility] EDleftAcute serous otitis media 08/21/19 [facility] mid back pain on left  Outreachsuccessful to mobile/home number [name] is able to verify [employer] Portability and Accountability Act) identifiers, date of birth (DOB) and address  Follow up  Discussed an injection with patient She confirms seeing her primary care provider (PCP)  She reports she is to get 2 hand braces to assist with her discomfort as she works and ha a Prescription (Rx) to go to obtain them  She confirms her bilateral carpal tunnel syndrome surgery is scheduled for March 07 2020 She and [facility] RN CM discussed the upcoming surgery  Questions were answered  Plans [facility] RN CM will follow up with [name] within the next 30  Business days prior to her carpal tunnel surgery Pt encouraged to return a call to [facility] RN CM prn    Iline Buchinger L. Lavina Hamman, RN, BSN, [place] Coordinator Office number [phone] Mobile number [phone]  Main THN number [phone] Fax number [phone]

## 2020-03-05 ENCOUNTER — Other Ambulatory Visit: Payer: Self-pay | Admitting: *Deleted

## 2020-03-05 ENCOUNTER — Other Ambulatory Visit: Payer: Self-pay

## 2020-03-05 NOTE — Patient Outreach (Signed)
[facility]) Care Management  03/05/2020  Donna Barker 08/13/1986 [id number]   [facility] outreach for [place] referred patient  Donna Barker was referred to [facility] on 11/24/19,THN Active on 11/27/19 By [place] insurance plan  Referral reasonoutreach member for [place]. For 5 ED visits in 12 daysand 6 EDvisits in the last 6 months from 08/21/19 to 10/31/19 for cellulitis and abscess, insect bite of right thigh, left acute serous otitis media and mid left back pain   Follow up Since the last outreach to Donna [facility] she has been seen by her primary care provider (PCP) and been provided with and Epipen  She had resolution to her left external ear external cellulitis  She continues to work at [employer] and will have bilateral carpal tunnel surgery on March 07 2020  She presently has bilateral braces she purchased from medical supply store in [place] near her job after presenting a Prescription (Rx) that was not accepted She can not recall the name of the medical supply store.  She voices interest in getting better fitting ones  Today THN RN CM answered questions about options for short term disability, long term disability, permanent disability and unemployment social security employment services She reports she has spoken with her boss and is pending a response. She reports she does not believe she has available vacation time or short term disability but has consulted with her family and friends about a plan. Various questions were answered  She was referred to her boss, Chief Executive Officer and corporate office to ask about her options  She also share concerns about the health of her grandmother. Support was offered related to her upcoming surgery, recovery from work options and her grandmother  Discussed [facility] outreach after her surgery Sent EMMIs via e-mail (gmail) for carpal tunnel release the procedure, carpal tunnel release" before the  surgery, carpal tunnel release: after surgery, dealing with a loved one's serious illness, adult and dealing with death, adult  Social Donna Barker is a 34 year old female who has support of her mother, [name], and herfriend, [name]. She lives with her life partner and 2 step children. Her job islisted asdetailing cars. High school diploma/GED She report does not cook a lot like her mother and younger sister She has a deceased brother She works at [employer] which is associated with [employer].    Conditionscellulitis and abscess, lanced abscess under right arm and right upper leg/thigh with residual several painful raised spots that have been draining, Insect bite,acute serous otitis media externa left ear, Mid back pain on left, history of urinary tract infection (UTI), constipation, history of dysmenorrhea and uterine fibroid, LMP 10/28/19 (2-3 menstrual cycles per month since she was a teenager), myofascial pain, history of yeast infections,strep throat infections with enlarged tonsils, frequent ear infections, hx of chronic bronchitis, hx of facial swelling from bee stings carries Epipen. 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"  Upcoming appointments 03/07/20 Bilateral carpal tunnel syndrome surgery Dr Andree Elk    Plans [facility] RN CM will follow up with Donna Galyon within the next 7-14 business days Pt encouraged to return a call to [facility] RN CM prn Routed note to MD  [facility] CM Care Plan Problem One     Most Recent Value  Care Plan Problem One Knowledge deficit of home care for thyroid nodule,  pain in hands  Role Documenting the Problem One Care Management Telephonic [place] for Problem One Active  THN Long Term Goal  over the next 60 days days patient will be able to verbalize with outreach interventions to manage recurrent infections at home   [place] Goal Start Date 11/27/19  [facility] Long Term Goal Met Date 01/18/20  THN CM Short Term Goal #1  over then next 7 days patient will follow up with primary care provider for further assessment and treatment plans as confirmed with outreach to patient  [facility] CM Short Term Goal #1 Start Date 11/27/19  [facility] CM Short Term Goal #1 Met Date 12/06/19  THN CM Short Term Goal #2  over the next 30 days patient will have ultrasound of her neck  and labs completed to assist with diagnosising hand and neck concerns per update in future outreach contact  [place] CM Short Term Goal #2 Start Date 01/12/20  [facility] CM Short Term Goal #2 Met Date 03/05/20  Interventions for Short Term Goal #2 met  THN CM Short Term Goal #3 over the next 45 day will report improvement in intake of fiber, fruits, vegetables and fluids as evidenced by improved bowel elimination during futre outreaches  THN CM Short Term Goal #3 Start Date 12/06/19    [facility] CM Care Plan Problem Two     Most Recent Value  Care Plan Problem Two bilateral carpal tunnel syndrome  Role Documenting the Problem Two Care Management Telephonic Coordinator  Care Plan for Problem Two Active  Interventions for Problem Two Long Term Goal  discussed her upcoming surgery answered questions, Sent EMMIs via e-mail (gmail) for carpal tunnel release the procedure, carpal tunnel release" before the surgery, carpal tunnel release: after surgery,  THN Long Term Goal over the next 60 days the patient will have and recover from bilateral carpal tunnel syndrome surgery as verbalized during outreach calls  [place] Term Goal Start Date 03/05/20  [facility] CM Short Term Goal #1  over the next 30 days patient will receive bilateral hand braces that are comfortable as verbalized during outreach calls  [facility] CM Short Term Goal #1 Start Date 03/06/20  Interventions for Short Term Goal #2  assessed present bilateral hand braces, Sent EMMIs via e-mail (gmail) for carpal tunnel release the  procedure, carpal tunnel release" before the surgery, carpal tunnel release: after surgery,      Bain Whichard L. Lavina Hamman, RN, BSN, [place] Coordinator Office number [phone] Mobile number [phone]  Main THN number [phone] Fax number [phone]

## 2020-03-12 ENCOUNTER — Other Ambulatory Visit: Payer: Self-pay | Admitting: *Deleted

## 2020-03-12 NOTE — Patient Outreach (Signed)
[facility]) Care Management  03/12/2020  Donna Barker Nov 07, 1985 [id number]   [facility] outreach for [place] referred patient  Donna Barker was referred to [facility] on 11/24/19,THN Active on 11/27/19 By [place] insurance plan  Referral reasonoutreach member for [name]. For 5 ED visits in 60 daysand 6 EDvisits in the last 6 months from 08/21/19 to 10/31/19 for cellulitis and abscess, insect bite of right thigh, left acute serous otitis media and mid left back pain   unsucessful Follow up of carpal tunnel surgery  No answer. THN RN CM left HIPAA [facility] Portability and Accountability Act) compliant voicemail message along with CM's contact info.    Plan: [facility] RN CM scheduled this patient for another call attempt within 4-7 business days   Elyanah Farino L. Lavina Hamman, RN, BSN, [place] Coordinator Office number [phone] Mobile number [phone]  Main [facility] number [phone] Fax number [phone]'

## 2020-03-14 ENCOUNTER — Ambulatory Visit: Payer: Self-pay | Admitting: *Deleted

## 2020-03-20 ENCOUNTER — Ambulatory Visit: Payer: Self-pay | Admitting: *Deleted

## 2020-04-02 ENCOUNTER — Other Ambulatory Visit: Payer: Self-pay | Admitting: *Deleted

## 2020-04-02 ENCOUNTER — Other Ambulatory Visit: Payer: Self-pay

## 2020-04-02 NOTE — Patient Outreach (Signed)
[place] [facility]) Care Management  04/02/2020  Donna Barker Oct 16, 1985 [id number]   [facility] outreach for [place] referred patient  Donna Barker was referred to [facility] on3/12/21,THN Active on 11/27/19 [employer] insurance plan  Referral reasonoutreach member for [place] ED visits in 65 daysand6 EDvisits in the last 6 monthsfrom 08/21/19 to 10/31/19 for cellulitis and abscess, insect bite of right thigh, left acute serous otitis media and mid left back pain   Successful Follow up of carpal tunnel surgery Patient is able to verify HIPAA ([place]) identifiers, date of birth (DOB) and address Today Donna Barker is being supported by her mother and stepdaughter Reviewed purpose of the outreach with patient= follow up surgery for carpal tunnel    Follow up notes   Donna Barker informed [facility] RN CM she fell down some steps face first related to her wearing "slides"/"flip flops" She reports her left wrist bent back in the fall  She reports problem moving her thumb and poor grip She has been evaluated by medical provider She still confirms her purchase splint causes some pain with use THN RN CM discussed wrapping to stabilize the wrist with wide ace wraps may assist   Reports bowel elimination is improved   EMMI materials Incomplete materials discussed THN RN CM corrected her e mail account to icloud vs gmail   THN RN CM answered questions about Human resources and unemployment  THN RN CM Interventions  THN RN CM sent the names of the in network DME agencies to visit or call for a possible assist with new hand braces    Plans [facility] RN CM will follow up with Donna Barker within the next 14-21 business days Pt encouraged to return a call to [facility] RN CM prn Goals Addressed              This Visit's Progress     Patient Stated   .  Patient will be able to manage home care after  bilateral carpal tunnel syndrome surgery and fall prevention (pt-stated)        CARE PLAN ENTRY (see longitudinal plan of care for additional care plan information)  Current Barriers:  Marland Kitchen Knowledge Deficits related to fall precautions . Decreased adherence to prescribed treatment for fall prevention . Cognitive Deficits  Clinical Goal(s):    Marland Kitchen Over the next 31 days, patient will not experience additional falls . over the next 60 days the patient will recover from bilateral carpal tunnel syndrome surgery as verbalized during outreach calls .  over the next 20 days patient will receive bilateral hand braces that are comfortable as verbalized during outreach calls   Interventions:  . Provided written and verbal education re: Potential causes of falls and Fall prevention strategies . Assessed for falls since last encounter. . Assessed patients knowledge of fall risk prevention secondary to previously provided education. . Assessed healing of bilateral carpal tunnel incisions  Patient Self Care Activities:   . De-clutter walkways . Change positions slowly . Wear secure fitting shoes at all times with ambulation . Utilize home lighting for dim lit areas . Have self and pet awareness at all times     Initial goal documentation Please see previous care plan information in Epic in the flow sheet section           Anber Mckiver L. Lavina Hamman, RN, BSN, [place] Coordinator Office number [phone] Mobile number [phone]  Main [facility] number [phone] Fax number [phone]

## 2020-04-24 ENCOUNTER — Other Ambulatory Visit: Payer: Self-pay | Admitting: *Deleted

## 2020-04-24 NOTE — Patient Outreach (Signed)
[place] [facility]) Care Management  04/24/2020  Taylan Mayhan 1986-06-04 [id number]   THN unsuccesful outreach for [place] referred patient  Ms Jatziri Goffredo was referred to [facility] on3/12/21,THN Active on 11/27/19 [employer] insurance plan  Referral reasonoutreach member for [place] ED visits in 46 daysand6 EDvisits in the last 6 monthsfrom 08/21/19 to 10/31/19 for cellulitis and abscess, insect bite of right thigh, left acute serous otitis media and mid left back pain  Prior to outreach [facility] RN CM checked EMMI manager and resent EMMIs not viewed to [email or web address] account  Unsuccessful outreach  No answer. THN RN CM left HIPAA [facility] Portability and Accountability Act) compliant voicemail message along with CM's contact info.   Plan: [facility] RN CM scheduled this THN engaged patient for another call attempt within 7-10 business days  Rekisha Welling L. Lavina Hamman, RN, BSN, [place] Coordinator Office number [phone] Mobile number [phone]  Main THN number [phone] Fax number [phone]

## 2020-05-01 ENCOUNTER — Other Ambulatory Visit: Payer: Self-pay | Admitting: *Deleted

## 2020-05-01 NOTE — Patient Outreach (Addendum)
[place] [facility]) Care Management  05/01/2020  Milayah Krell 07-28-1986 [id number]   THN second unsuccesful outreach for [facility] referred patient  Ms Latavia Goga was referred to [facility] on3/12/21,THN Active on 11/27/19 [employer] insurance plan  Referral reasonoutreach member for [place] ED visits in 76 daysand6 EDvisits in the last 6 monthsfrom 08/21/19 to 10/31/19 for cellulitis and abscess, insect bite of right thigh, left acute serous otitis media and mid left back pain  Prior to outreach [facility] RN CM checked EMMI manager and resent EMMIs not viewed to [email or web address] account  Second Unsuccessful outreach  No answer. THN RN CM left HIPAA [facility] Portability and Accountability Act) compliant voicemail message along with CM's contact info.   Plan: [facility] RN CM scheduled this THN engaged patient for a final call attempt within 7-10 business days [facility] RN CM sent a [facility] engaged patient unsuccessful outreach letter  [facility] RN CM left an unsuccessful voice message for patient on 04/24/20  Joelene Millin L. Lavina Hamman, RN, BSN, [place] Coordinator Office number [phone] Mobile number [phone]  Main THN number [phone] Fax number [phone]

## 2020-05-08 ENCOUNTER — Other Ambulatory Visit: Payer: Self-pay

## 2020-05-08 ENCOUNTER — Other Ambulatory Visit: Payer: Self-pay | Admitting: *Deleted

## 2020-05-30 NOTE — Patient Outreach (Signed)
[facility]) Care Management  05/30/2020  Carliyah Cotterman 12-17-1985 [id number]   Late entry for 05/08/20 [place] outreach to [place] patient  Ms Blimie Vaness was referred to [facility] on3/12/21,THN Active on 11/27/19 [employer] insurance plan  Referral reasonoutreach member for [place] [facility].[place].Skeeters ED visits in 52 daysand6 EDvisits in the last 6 monthsfrom 08/21/19 to 10/31/19 for cellulitis and abscess, insect bite of right thigh, left acute serous otitis media and mid left back pain  Patient is able to verify HIPAA ([place] and [place]) identifiers Reviewed and addressed the purpose of the follow up call with the patient  Consent: [facility] RN CM reviewed [facility] services with patient. Patient gave verbal consent for services.   Follow up  Ms Morency confirms she has returned to work but with light duty (only using right hand) She reports she is doing okay but continues to have pain of her left wrist at intervals She reports difficulty with gripping items and pain that radiates up her arm. She reports she is to get more imaging and therapy sessions that are to start on 3/35/45 She voiced uncertainty about therapy sessions THN RN CM provided encouragement for therapy sessions and discussed the importance of working with someone who can be aware of her limitations, offer recommendations and make her MD aware of noted concerns  She voiced understanding   Covid  Ms Landrigan confirms she was tested positive for covid with also a fever reported of 103, loss of taste, headache Exposure to covid via her partner She did quarantine for 10 days  She reports she was vaccinated after the appropriate time frame [facility] RN CM reviewed the importance of the 3 W's (Washing hands, waiting 6  Ft, wearing masks)   Ms Mcsorley confirms she received her EMMI materials resent to her   She reports she is  managing related to grief (passing of her grandmother) She reports she is providing support for her mother during this time  Plans [facility] RN CM will follow up with Ms Baldini within the next 30 business days  [place] L. Lavina Hamman, RN, BSN, [place] Coordinator Office number [phone] Main [facility] number [phone] Fax number [phone]

## 2020-06-05 ENCOUNTER — Other Ambulatory Visit: Payer: Self-pay | Admitting: *Deleted

## 2020-06-05 ENCOUNTER — Other Ambulatory Visit: Payer: Self-pay

## 2020-06-05 NOTE — Patient Outreach (Signed)
[place] [facility]) Care Management  06/05/2020  Donna Barker 06-Sep-1986 [id number]   [facility] outreach to [place] patient  Donna Donna Barker was referred to [facility] on3/12/21,THN Active on 11/27/19 [employer] insurance plan  Referral reasonoutreach member for [place] ED visits in 20 daysand6 EDvisits in the last 6 monthsfrom 08/21/19 to 10/31/19 for cellulitis and abscess, insect bite of right thigh, left acute serous otitis media and mid left back pain  Patient is able to verify HIPAA ([place] and [place]) identifiers Reviewed and addressed the purpose of the follow up call with the patient  Consent: THN([facility] [place]) RN CM reviewed [facility] services with patient. Patient gave verbal consent for services.   Follow up  Patient is able to verify HIPAA ([place] and [place]) identifiers Reviewed and addressed the purpose of the follow up call with the patient  Consent: [facility] RN CM reviewed [facility] services with patient. Patient gave verbal consent for services.  Donna Barker was greeted with a happy birthday song  She voiced appreciation Donna Barker continues to progress and attend her therapies for left wrist every 2 weeks  She voices concern with the PT progression with her left hand and was provided encouragement to continue sessions and continue sharing her symptoms with providers 05/27/20 Prefabricated orthosis issued to patient Neoprene wrist wrap  She has not had any other reported falls   Other concerns with her constipation and urinary symptoms are being managed She reports increase in fluid intake She reports liking green naked drinks  She was provided with encouragement  THN RN CM discussed the importance of knowing before she goes and the provider to access first to include her primary care office  She voiced understanding  and with teach back method was able to discuss action plan for seeking medical care   THN RN CM discussed THN progression of services  Donna Barker does not have any chronic diagnoses (HTN, CHF< DM, CKD, Afib, etc)  She agreed to follow up within the next 90 business days and the availability to outreach to [facility] RN CM prn   Plans [facility] RN CM will follow up with Donna Barker within the next 47 business days for follow up of possible care coordination or disease management needs Pt encouraged to return a call to [facility] RN CM prn Goals Addressed              This Visit's Progress     Patient Stated   .  [facility]) Patient will be able to manage home care after bilateral carpal tunnel syndrome surgery and fall prevention (pt-stated)   On track     [place] (see longitudinal plan of care for additional care plan information)  Current Barriers:  Marland Kitchen Knowledge Deficits related to fall precautions . Decreased adherence to prescribed treatment for fall prevention . Cognitive Deficits  Clinical Goal(s):    Marland Kitchen Over the next 31 days, patient will not experience additional falls-06/05/20 Goal met  . over the next 60 days the patient will recover from bilateral carpal tunnel syndrome surgery as verbalized during outreach calls- 06/05/20  continues to progress and attend her therapies She voices concern with the PT progression with her left hand and was provided encouragement to continue sessions and continue sharing her symptoms with providers .  over the next 20 days patient will receive bilateral hand braces that are comfortable as verbalized during outreach calls-06/05/20 goal met 05/27/20  Prefabricated orthosis issued to patient Neoprene wrist wrap   Interventions:  . Provided written and verbal education re: Potential causes of falls and Fall prevention strategies . Assessed for falls since last encounter. . Assessed patients knowledge of fall risk prevention secondary to previously provided  education. . Assessed healing of bilateral carpal tunnel incisions  Patient Self Care Activities:   . De-clutter walkways . Change positions slowly . Wear secure fitting shoes at all times with ambulation . Utilize home lighting for dim lit areas . Have self and pet awareness at all times . Continue to attend outpatient PT therapies as ordered for left wrist  . Report any increased symptoms to medical providers  . Take medications as ordered . Wear brace as ordered      Please see past updates related to this goal by clicking on the "Past Updates" button in the selected goal  Please see previous care plan information in Epic in the flow sheet section         Donna Barker L. Lavina Hamman, RN, BSN, [place] Coordinator Office number [phone] Main [facility] number [phone] Fax number [phone]

## 2020-09-04 ENCOUNTER — Other Ambulatory Visit: Payer: Self-pay

## 2020-09-04 ENCOUNTER — Other Ambulatory Visit: Payer: Self-pay | Admitting: *Deleted

## 2020-09-04 ENCOUNTER — Encounter: Payer: Self-pay | Admitting: *Deleted

## 2020-09-04 NOTE — Patient Outreach (Signed)
[place] [facility]) Care Management  09/04/2020  Donna Barker 1985/11/25 [id number]   [facility] outreach to [place] patient  Donna Barker was referred to [facility] on3/12/21,THN Active on 11/27/19 [employer] insurance plan  Referral reasonoutreach member for [place]ke[place] ED visits in 7 daysand6 EDvisits in the last 6 monthsfrom 08/21/19 to 10/31/19 for cellulitis and abscess, insect bite of right thigh, left acute serous otitis media and mid left back pain  Patient is able to verify HIPAA ([place] and [place]) identifiers Reviewed and addressed the purpose of the follow up call with the patient  Consent: THN([facility] [place]) RN CM reviewed [facility] services with patient. Patient gave verbal consent for services.   Follow up   Bilateral carpal tunnel syndrome s/p left CTR "Left hand still not right" Strength is reported poor with issues with gripping and moments of numbness  "feels like I've been shocked", burning She has completed her outpatient occupational therapy (OT) sessions She was encouraged by OT/PT to have a follow up with her surgeon related to continued pain She confirms she outreached for a follow up appointment with Dr Leotis Pain, plastic surgeon, via my chart without a response at this time. 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, parking cars and walking She was also encouraged to use cool compresses, elevation, an ortho shoe and over the counter (OTC) pain medicine prn THN RN CM and patient discussed the risks of possible further injury and/or continues stress to left  foot   Dental  Pt reports recent dental concern She was encouraged to find a dentist but has not found one yet She reports having insurance coverage but being informed of out of pocket expense [facility] RN CM also discussed the importance of good dental hygiene to reduce her recurrent infections  [facility] RN CM interventions Completed a conference call with patient to Dr Leotis Pain [phone] Spoke with Tillie Rung first available appointment provided for Thursday 09/20/19 0845 [facility]  Unsuccessful transfer to the nurse of Dr Leotis Pain to report the symptoms patient is having  [place] outreached to [phone] Dr Leotis Pain office x 2 Reached nurse at prompt 2 spoke with Hilda Blades  Reviewed reported continued left hand/writ pain symptoms by pt today. Hilda Blades to outreach to MD, pt and [facility] RN CM prn [facility] RN CM called to update Donna Barker  Plans [facility] RN CM will follow up with Donna Barker within the next 66 business days for follow up of possible care coordination or disease management needs Pt encouraged to return a call to [facility] RN CM prn Goals      Patient Stated   .  [facility]) Cope with Chronic Pain (pt-stated)      Timeframe:  Long-Range Goal Priority:  High Start Date:                 09/04/20           Expected End Date:      12/12/20                 Follow Up Date 09/20/20    -  spend time with positive people - tell myself I can (not I can't) - think of new ways to do favorite things - use distraction techniques     Notes:     .  [facility]) Manage Chronic Pain (pt-stated)      Timeframe:  Short-Term Goal Priority:  High Start Date:     09/04/20                        Expected End Date:   11/11/20                    Follow Up Date 09/20/20   - plan exercise or activity when pain is best controlled - prioritize tasks for the day  Follow up with hand surgeon  Notes: 09/04/20 assisted to get /u appointment with Dr Leotis Pain on 09/19/20 after completion of outpatient therapy, ongoing left hand pain         Shawnmichael Parenteau L. Lavina Hamman, RN, BSN, [place] Coordinator Office number [phone] Main [facility] number [phone] Fax number [phone]

## 2020-09-05 ENCOUNTER — Other Ambulatory Visit: Payer: Self-pay | Admitting: *Deleted

## 2020-09-05 NOTE — Patient Outreach (Signed)
[facility] [facility]) Care Management  09/05/2020  Donna Barker November 08, 1985 [id number]   [place] coordination incoming collaboration from Dr Leotis Pain office  Received a call from Hilda Blades to update that Dr Leotis Pain requests patient go to urgent care if symptoms increase prior to January 2022 appointment Debra to outreach to patient also    Plans [facility] RN CM will follow up with [name] within the next 69 business days for follow up of possible care coordination or disease management needs Pt encouraged to return a call to [facility] RN CM prn  Joelene Millin L. Lavina Hamman, RN, BSN, [place] Coordinator Office number [phone] Mobile number [phone]  Main THN number [phone] Fax number [phone]

## 2020-09-20 ENCOUNTER — Other Ambulatory Visit: Payer: Self-pay

## 2020-09-20 ENCOUNTER — Other Ambulatory Visit: Payer: Self-pay | Admitting: *Deleted

## 2020-09-20 NOTE — Patient Outreach (Signed)
[place] [facility]) Care Management  09/20/2020  Donna Barker 1985/10/23 [id number]   [facility] outreach to [place] patient  Donna Donna Barker was referred to [facility] on3/12/21,THN Active on 11/27/19 [employer] insurance plan  Referral reasonoutreach member for [place] ED visits in 86 daysand6 EDvisits in the last 6 monthsfrom 08/21/19 to 10/31/19 for cellulitis and abscess, insect bite of right thigh, left acute serous otitis media and mid left back pain    Successful outreach to patient Patient is able to verify HIPAA ([place] and [place]) identifiers Reviewed and addressed the purpose of the follow up call with the patient  Follow up  Left hand pain Donna Barker did see Dr Leotis Pain 09/19/20 She reports she is to have further left hand nerve testing/EMG and OT hand therapy  Services for 6 weeks   Left foot pain  She reports she is aware she is suppose "to be off" her left foot but she does not have opportunity as she works for [employer], 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 [place]  She reports her symptoms includes a pulling pain on the sole of her left foot when walking  [facility] RN CM encouraged her to try possible orthotics, insoles or foot wear for comfort and good support to prevent worsening injury  She voiced understanding Dentists Confirms Bright health coverage but with an online search there are not in network dental providers  to consult with [place]    ED visit on 09/12/20 for chest pain after she and others replaced a large TV at her home She is better today   Plan Patient agrees to care plan and follow up within the next 21-28 business days  Pt encouraged to return a call to [facility] RN CM prn Goals Addressed              This Visit's Progress     Patient Stated   .  [facility]) Cope with Chronic Pain (pt-stated)   On track     Timeframe:  Long-Range Goal Priority:  High Start Date:                  09/04/20           Expected End Date:      12/12/20                 Follow Up Date 10/14/20    - spend time with positive people - tell myself I can (not I can't) - think of new ways to do favorite things - use distraction techniques      Notes:     .  [facility]) Manage Chronic Pain (pt-stated)   On track     Timeframe:  Short-Term Goal Priority:  High Start Date:     09/04/20                        Expected End Date:   11/11/20                    Follow Up Date 10/14/20   - plan exercise or activity when pain is best controlled - prioritize tasks for the day  Follow up with hand surgeon      Notes: 09/04/20 assisted to get /u appointment with Dr Leotis Pain on 09/19/20 after completion of outpatient therapy, ongoing left hand pain        Wilena Tyndall L. Lavina Hamman, RN, BSN, CCM [facility]  Telephonic Care Management Care Coordinator Office number [phone] Main [facility] number [phone] Fax number [phone]

## 2020-10-08 ENCOUNTER — Other Ambulatory Visit: Payer: Self-pay | Admitting: *Deleted

## 2020-10-08 DIAGNOSIS — G5603 Carpal tunnel syndrome, bilateral upper limbs: Secondary | ICD-10-CM

## 2020-10-08 DIAGNOSIS — N390 Urinary tract infection, site not specified: Secondary | ICD-10-CM

## 2020-10-08 DIAGNOSIS — M7918 Myalgia, other site: Secondary | ICD-10-CM

## 2020-10-08 NOTE — Patient Outreach (Signed)
[place] [facility]) Care Management  10/08/2020  Donna Barker October 19, 1985 [id number]   [place] coordination- referral to care guide  Order for care guide assistance for possible list of dentists/financial strain (uninsured- will be out of pocket expense) Online site for present coverage not indicating a list of dentists  [name] was referred [facility] on3/12/21,THN Active on 11/27/19 [employer] insurance plan  Referral reasonoutreach member for [place] ED visits in 60 daysand6 EDvisits in the last 6 monthsfrom 08/21/19 to 10/31/19 for cellulitis and abscess, insect bite of right thigh, left acute serous otitis media and mid left back pain   Plan follow up within the next 21-28 business days   [name] L. Lavina Hamman, RN, BSN, [place] Coordinator Office number [phone] Mobile number [phone]  Main THN number [phone] Fax number [phone]

## 2020-10-10 ENCOUNTER — Telehealth: Payer: Self-pay | Admitting: Internal Medicine

## 2020-10-10 NOTE — Telephone Encounter (Signed)
   Telephone encounter was:  Successful.  10/10/2020 Name: Donna Barker MRN: [id number] DOB: [birth year]  Donna Barker is a 35 y.o. year old female who is a primary care patient of Orpah Greek, DO . The community resource team was consulted for assistance with Financial Difficulties related to dental needs and disability advocacy center referral.   Care guide performed the following interventions: Patient provided with information about care guide support team and interviewed to confirm resource needs Investigation of community resources performed Discussed resources to assist with dental care, will email her a list of places that are little to no money for dental care Obtained verbal consent to place patient referral to [facility], disability advocacy center Placed referral to [facility] via email. .  Follow Up Plan:  Care guide will outreach resources to assist patient with disability because she does not have use of her right hand and Client will reach out to me again if she needs help or haven't heard from the [place], [place], Care Management Phone: [phone] Email: [email or web address]

## 2020-10-10 NOTE — Telephone Encounter (Signed)
   Telephone encounter was:  Successful.  10/10/2020 Name: Donna Barker MRN: [id number] DOB: [birth year]  Donna Barker is a 35 y.o. year old female who is a primary care patient of Orpah Greek, DO . The community resource team was consulted for assistance with Financial Difficulties related to dental needs. Also disability services.   Care guide performed the following interventions: Investigation of community resources performed sent an email to the patient with a list of dentist at a sliding scale or no costs at all. Also placed a referral to the [facility]. Marland Kitchen  Follow Up Plan:  Care guide will outreach resources to assist patient with disability claims and the [facility].   [place], Care Management Phone: [phone] Email: [email or web address]

## 2020-10-14 ENCOUNTER — Other Ambulatory Visit: Payer: Self-pay | Admitting: *Deleted

## 2020-10-14 ENCOUNTER — Telehealth: Payer: Self-pay | Admitting: Internal Medicine

## 2020-10-14 NOTE — Telephone Encounter (Signed)
° °  Telephone encounter was:  Successful.  10/14/2020 Name: Donna Barker MRN: [id number] DOB: 1986/03/25  Donna Barker is a 35 y.o. year old female who is a primary care patient of Orpah Greek, DO . The community resource team was consulted for assistance with needed dental resources and disability services.   Care guide performed the following interventions: Follow up call placed to community resources to determine status of patients referral Follow up call placed to the patient to discuss status of referral Patient confirmed that she did receive the two emails that I sent with dental resources and she said that the [facility] has made contact with her and they are going to help with her getting disability and a possible better job. .  Follow Up Plan:  No further follow up planned at this time. The patient has been provided with needed resources.  [place], Care Management Phone: [phone] Email: [email or web address]

## 2020-10-14 NOTE — Patient Outreach (Signed)
[place] [facility]) Care Management  10/14/2020  Donna Barker December 31, 1985 [id number]  [facility] Unsuccessful outreach to [place] patient  [name] was referred to [facility] on3/12/21,THN Active on 11/27/19 [employer] insurance plan  Referral reasonoutreach member for [place] ED visits in 18 daysand6 EDvisits in the last 6 monthsfrom 08/21/19 to 10/31/19 for cellulitis and abscess, insect bite of right thigh, left acute serous otitis media and mid left back pain  0 [place] system admissions/ED in the last 6 months  [facility] visit 10/07/20 shortness of breath (sob)    Noted since last [facility] RN CM  outreach on 09/20/20 Pt has had care guide outreaches on 10/10/20 & 1/31/222 for dental resources and disability services Pt confirmed 10/14/20 that she did receive 2 emails care guide sent with dental resources and [facility] has made contact with her to help with getting disability and a possible better job   Outreach attempt to the home number  No answer. THN RN CM left HIPAA [facility] Portability and Accountability Act) compliant voicemail message along with CM's contact info.   Plan: [facility] RN CM scheduled this patient for another call attempt within 4-7 business days  [name] L. Lavina Hamman, RN, BSN, [place] Coordinator Office number [phone] Mobile number [phone]  Main THN number [phone] Fax number [phone]

## 2020-10-25 ENCOUNTER — Other Ambulatory Visit: Payer: Self-pay | Admitting: *Deleted

## 2020-10-25 NOTE — Patient Outreach (Addendum)
[place] [facility]) Care Management  10/25/2020  Letia Guidry 25-Jan-1986 [id number]   THN 2nd Unsuccessful outreach to [place] patient  Ms Deliliah Spranger was referred to [facility] on3/12/21,THN Active on 11/27/19 [employer] insurance plan  Referral reasonoutreach member for [place] ED visits in 73 daysand6 EDvisits in the last 6 monthsfrom 08/21/19 to 10/31/19 for cellulitis and abscess, insect bite of right thigh, left acute serous otitis media and mid left back pain  0 [place] system admissions/ED in the last 6 months  [facility] visit 10/07/20 shortness of breath (sob)    Noted since last [facility] RN CM  outreach on 09/20/20 Pt has had care guide outreaches on 10/10/20 & 1/31/222 for dental resources and disability services Pt confirmed 10/14/20 that she did receive 2 emails care guide sent with dental resources and [facility] has made contact with her to help with getting disability and a possible better job   Outreach attempt to the home number  No answer. THN RN CM left HIPAA [facility] Portability and Accountability Act) compliant voicemail message along with CM's contact info.   Plan: [facility] RN CM scheduled this patient for another call attempt within 4-7 business days unsuccessful outreach letter sent   Joelene Millin L. Lavina Hamman, RN, BSN, [place] Coordinator Office number [phone] Mobile number [phone]  Main THN number [phone] Fax number [phone]

## 2020-11-27 ENCOUNTER — Other Ambulatory Visit: Payer: Self-pay | Admitting: *Deleted

## 2020-11-27 NOTE — Patient Outreach (Addendum)
[facility]) Care Management  11/27/2020  Annaliza Zia June 27, 1986 [id number]  [facility] outreach to complex care patient   Ms Nyanna Heideman was referred to [facility] on3/12/21,THN Active on 11/27/19 [employer] insurance plan  Referral reasonoutreach member for [place] ED visits in 51 daysand6 EDvisits in the last 6 monthsfrom 08/21/19 to 10/31/19 for cellulitis and abscess, insect bite of right thigh, left acute serous otitis media and mid left back pain  0 [place] system admissions/ED in the last 6 months  [facility] visit 10/07/20 shortness of breath (sob)  Patient is able to verify HIPAA ([place] and [place]) identifiers Reviewed and addressed the purpose of the follow up call with the patient  Consent: [facility] RN CM reviewed [facility] services with patient. Patient gave verbal consent for services.  Follow up  Pt reports she remains about the same Not going to therapy Still working  Want to get new brace to help with grip  Completed services with [facility] care guide on 10/14/20 She reports "some" beneficial resources offered  Plan Patient agrees to care plan and follow up within the next 30 business days Pt encouraged to return a call to [facility] RN CM prn Goals Addressed              This Visit's Progress     Patient Stated   .  COMPLETED: (THN) Cope with Chronic Pain (pt-stated)   On track     Timeframe:  Long-Range Goal Priority:  High Start Date:                 09/04/20           Expected End Date:      12/12/20                 Follow Up Date    - spend time with positive people - tell myself I can (not I can't) - think of new ways to do favorite things - use distraction techniques     Notes: 11/27/20 reports coping well with chronic pain Denies depression symptoms closing goal     .  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 Lavina Hamman, RN, BSN, [place] Coordinator Office number [phone] Main [facility] number [phone] Fax number [phone]

## 2020-12-30 ENCOUNTER — Other Ambulatory Visit: Payer: Self-pay | Admitting: *Deleted

## 2020-12-30 NOTE — Patient Outreach (Signed)
[facility] [facility]) Care Management  12/30/2020  Lizzet Hendley 04-10-1986 [id number]   [facility] Unsuccessful outreach  Ms Emerald Shor was referred to [facility] on3/12/21,THN Active on 11/27/19 [employer] insurance plan  Referral reasonoutreach member for [place] ED visits in 60 daysand6 EDvisits in the last 6 monthsfrom 08/21/19 to 10/31/19 for cellulitis and abscess, insect bite of right thigh, left acute serous otitis media and mid left back pain  [place] system admissions/ED in the last 6 months- 0  [facility] in the last 6 months 10/21/20 ED  Pain in finger of right hand 1/24/22shortness of breath (sob) urgent care visit - covid 09/12/20 ED chest pain, r/o MI 09/03/20 urgent care left foot pain 08/24/20 dentalgia  Outreach attempt to the home number  No answer. THN RN CM left HIPAA [facility] Portability and Accountability Act) compliant voicemail message along with CM's contact info.   Plan: [facility] RN CM scheduled this patient for another call attempt within 4-7 business days  Sae Handrich L. Lavina Hamman, RN, BSN, [place] Coordinator Office number [phone] Mobile number [phone]  Main THN number [phone] Fax number [phone]

## 2021-01-14 ENCOUNTER — Other Ambulatory Visit: Payer: Self-pay | Admitting: *Deleted

## 2021-01-14 NOTE — Patient Outreach (Signed)
Donna Barker [facility]) Care Management  01/14/2021  Donna Barker 1985/12/21 [id number]   THN second Unsuccessful outreach  Ms [name] was referred to [facility] on3/12/21,THN Active on 11/27/19 by [facility] insurance plan  Referral reasonoutreach member for [place] ED visits in 45 daysand6 EDvisits in the last 6 monthsfrom 08/21/19 to 10/31/19 for cellulitis and abscess, insect bite of right thigh, left acute serous otitis media and mid left back pain  [place] system admissions/ED in the last 6 months- 0  [facility]ED in the last 6 months 10/21/20 ED  Pain in finger of right hand 1/24/22shortness of breath (sob) urgent care visit - covid 09/12/20 ED chest pain, r/o MI 09/03/20 urgent care left foot pain 08/24/20 dentalgia  Last successful outreach on 11/27/20  Previous unsuccessful outreaches on 12/30/20  Outreach attempt to the home number  No answer. THN RN CM left HIPAA [facility] Portability and Accountability Act) compliant voicemail message along with CM's contact info.   Plan: [facility] RN CM will complete one final outreach attempt to patient within the next 7-14 business days  Kensy Blizard L. Lavina Hamman, RN, BSN, [place] Coordinator Office number [phone] Mobile number [phone]  Main THN number [phone] Fax number [phone]

## 2021-01-28 ENCOUNTER — Other Ambulatory Visit: Payer: Self-pay

## 2021-01-28 ENCOUNTER — Other Ambulatory Visit: Payer: Self-pay | Admitting: *Deleted
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
## 2021-04-30 ENCOUNTER — Other Ambulatory Visit: Payer: Self-pay | Admitting: *Deleted

## 2021-04-30 NOTE — Patient Outreach (Signed)
[facility]) Care Management  04/30/2021  Donna Barker Apr 23, 1986 [id number]   [place] coordination  Left a voice message for patient  Joelene Millin L. Lavina Hamman, RN, BSN, [place] Coordinator Office number [phone] Mobile number [phone]  Main THN number [phone] Fax number [phone]

## 2021-10-18 DIAGNOSIS — M67912 Unspecified disorder of synovium and tendon, left shoulder: Secondary | ICD-10-CM | POA: Diagnosis not present

## 2021-10-18 DIAGNOSIS — M25361 Other instability, right knee: Secondary | ICD-10-CM | POA: Diagnosis not present

## 2021-10-18 DIAGNOSIS — M25512 Pain in left shoulder: Secondary | ICD-10-CM | POA: Diagnosis not present

## 2021-10-23 DIAGNOSIS — M546 Pain in thoracic spine: Secondary | ICD-10-CM | POA: Diagnosis not present

## 2021-10-23 DIAGNOSIS — M79622 Pain in left upper arm: Secondary | ICD-10-CM | POA: Diagnosis not present

## 2021-10-23 DIAGNOSIS — M542 Cervicalgia: Secondary | ICD-10-CM | POA: Diagnosis not present

## 2021-10-23 DIAGNOSIS — M25512 Pain in left shoulder: Secondary | ICD-10-CM | POA: Diagnosis not present

## 2021-10-23 DIAGNOSIS — M5412 Radiculopathy, cervical region: Secondary | ICD-10-CM | POA: Diagnosis not present

## 2021-10-23 DIAGNOSIS — R69 Illness, unspecified: Secondary | ICD-10-CM | POA: Diagnosis not present

## 2021-10-31 DIAGNOSIS — S4992XA Unspecified injury of left shoulder and upper arm, initial encounter: Secondary | ICD-10-CM | POA: Diagnosis not present

## 2021-10-31 DIAGNOSIS — Z5329 Procedure and treatment not carried out because of patient's decision for other reasons: Secondary | ICD-10-CM | POA: Diagnosis not present

## 2021-10-31 DIAGNOSIS — R69 Illness, unspecified: Secondary | ICD-10-CM | POA: Diagnosis not present

## 2021-10-31 DIAGNOSIS — S0993XA Unspecified injury of face, initial encounter: Secondary | ICD-10-CM | POA: Diagnosis not present

## 2021-10-31 DIAGNOSIS — M25512 Pain in left shoulder: Secondary | ICD-10-CM | POA: Diagnosis not present

## 2021-10-31 DIAGNOSIS — R6884 Jaw pain: Secondary | ICD-10-CM | POA: Diagnosis not present

## 2021-11-07 DIAGNOSIS — M1711 Unilateral primary osteoarthritis, right knee: Secondary | ICD-10-CM | POA: Diagnosis not present

## 2021-12-26 ENCOUNTER — Encounter: Payer: Self-pay | Admitting: Emergency Medicine

## 2021-12-26 ENCOUNTER — Other Ambulatory Visit: Payer: Self-pay

## 2021-12-26 DIAGNOSIS — D259 Leiomyoma of uterus, unspecified: Secondary | ICD-10-CM | POA: Insufficient documentation

## 2021-12-26 DIAGNOSIS — S3991XA Unspecified injury of abdomen, initial encounter: Secondary | ICD-10-CM | POA: Diagnosis not present

## 2021-12-26 DIAGNOSIS — S9031XA Contusion of right foot, initial encounter: Secondary | ICD-10-CM | POA: Insufficient documentation

## 2021-12-26 DIAGNOSIS — Y92410 Unspecified street and highway as the place of occurrence of the external cause: Secondary | ICD-10-CM | POA: Insufficient documentation

## 2021-12-26 DIAGNOSIS — S99921A Unspecified injury of right foot, initial encounter: Secondary | ICD-10-CM | POA: Diagnosis present

## 2021-12-26 MED ORDER — MORPHINE SULFATE (PF) 4 MG/ML IV SOLN
4.0000 mg | Freq: Once | INTRAVENOUS | Status: AC
  Administered 2021-12-26: 4 mg via INTRAVENOUS
  Filled 2021-12-26: qty 1
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
## 2021-12-27 ENCOUNTER — Emergency Department: Payer: 59

## 2021-12-27 ENCOUNTER — Emergency Department
Admission: EM | Admit: 2021-12-27 | Discharge: 2021-12-27 | Disposition: A | Discharge status: Home / Self Care | Payer: 59 | Attending: Emergency Medicine | Admitting: Emergency Medicine

## 2021-12-27 LAB — URINALYSIS, ROUTINE W REFLEX MICROSCOPIC
Bacteria, UA: NONE SEEN
Bilirubin Urine: NEGATIVE
Glucose, UA: NEGATIVE mg/dL
Ketones, ur: NEGATIVE mg/dL
Leukocytes,Ua: NEGATIVE
Nitrite: NEGATIVE
Protein, ur: NEGATIVE mg/dL
Specific Gravity, Urine: 1.031 — ABNORMAL HIGH (ref 1.005–1.030)
pH: 5 (ref 5.0–8.0)

## 2021-12-27 LAB — COMPREHENSIVE METABOLIC PANEL
ALT: 17 U/L (ref 0–44)
AST: 18 U/L (ref 15–41)
Albumin: 3.7 g/dL (ref 3.5–5.0)
Alkaline Phosphatase: 59 U/L (ref 38–126)
Anion gap: 8 (ref 5–15)
BUN: 17 mg/dL (ref 6–20)
CO2: 22 mmol/L (ref 22–32)
Calcium: 8.6 mg/dL — ABNORMAL LOW (ref 8.9–10.3)
Chloride: 107 mmol/L (ref 98–111)
Creatinine, Ser: 0.81 mg/dL (ref 0.44–1.00)
GFR, Estimated: >60 mL/min (ref >60)
Glucose, Bld: 94 mg/dL (ref 70–99)
Potassium: 3.4 mmol/L — ABNORMAL LOW (ref 3.5–5.1)
Sodium: 137 mmol/L (ref 135–145)
Total Bilirubin: 0.8 mg/dL (ref 0.3–1.2)
Total Protein: 7.3 g/dL (ref 6.5–8.1)

## 2021-12-27 LAB — CBC WITH DIFFERENTIAL/PLATELET
Abs Immature Granulocytes: 0.02 10*3/uL (ref 0.00–0.07)
Basophils Absolute: 0 10*3/uL (ref 0.0–0.1)
Basophils Relative: 0 %
Eosinophils Absolute: 0.1 10*3/uL (ref 0.0–0.5)
Eosinophils Relative: 1 %
HCT: 40.5 % (ref 36.0–46.0)
Hemoglobin: 13.1 g/dL (ref 12.0–15.0)
Immature Granulocytes: 0 %
Lymphocytes Relative: 36 %
Lymphs Abs: 3.6 10*3/uL (ref 0.7–4.0)
MCH: 29.4 pg (ref 26.0–34.0)
MCHC: 32.3 g/dL (ref 30.0–36.0)
MCV: 91 fL (ref 80.0–100.0)
Monocytes Absolute: 0.5 10*3/uL (ref 0.1–1.0)
Monocytes Relative: 5 %
Neutro Abs: 5.7 10*3/uL (ref 1.7–7.7)
Neutrophils Relative %: 58 %
Platelets: 232 10*3/uL (ref 150–400)
RBC: 4.45 MIL/uL (ref 3.87–5.11)
RDW: 13 % (ref 11.5–15.5)
WBC: 10 10*3/uL (ref 4.0–10.5)
nRBC: 0 % (ref 0.0–0.2)

## 2021-12-27 LAB — POC URINE PREG, ED: Preg Test, Ur: NEGATIVE

## 2021-12-27 LAB — LIPASE, BLOOD: Lipase: 28 U/L (ref 11–51)

## 2021-12-27 MED ORDER — IOHEXOL 300 MG/ML  SOLN
100.0000 mL | Freq: Once | INTRAMUSCULAR | Status: AC | PRN
  Administered 2021-12-27: 100 mL via INTRAVENOUS

## 2021-12-27 MED ORDER — KETOROLAC TROMETHAMINE 30 MG/ML IJ SOLN
15.0000 mg | Freq: Once | INTRAMUSCULAR | Status: AC
  Administered 2021-12-27: 15 mg via INTRAVENOUS
  Filled 2021-12-27: qty 1

## 2021-12-27 NOTE — ED Notes (Signed)
Patient transported to CT 
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
## 2021-12-27 NOTE — Discharge Instructions (Signed)
The CT of your abdomen did not show any internal injuries.  X-rays of your foot and ankle and lower leg did not show any broken bones.  You likely sprained your ankle from twisting it.  You can take Tylenol and Motrin for pain.  Please follow-up with your primary care provider. ?

## 2022-04-06 ENCOUNTER — Emergency Department: Payer: 59

## 2022-04-06 ENCOUNTER — Other Ambulatory Visit: Payer: Self-pay

## 2022-04-06 DIAGNOSIS — R079 Chest pain, unspecified: Secondary | ICD-10-CM | POA: Insufficient documentation

## 2022-04-06 DIAGNOSIS — R0602 Shortness of breath: Secondary | ICD-10-CM | POA: Insufficient documentation

## 2022-04-06 DIAGNOSIS — Z5321 Procedure and treatment not carried out due to patient leaving prior to being seen by health care provider: Secondary | ICD-10-CM | POA: Diagnosis not present

## 2022-04-06 LAB — CBC
HCT: 38.9 % (ref 36.0–46.0)
Hemoglobin: 13.1 g/dL (ref 12.0–15.0)
MCH: 29.8 pg (ref 26.0–34.0)
MCHC: 33.7 g/dL (ref 30.0–36.0)
MCV: 88.4 fL (ref 80.0–100.0)
Platelets: 240 10*3/uL (ref 150–400)
RBC: 4.4 MIL/uL (ref 3.87–5.11)
RDW: 13.3 % (ref 11.5–15.5)
WBC: 10.5 10*3/uL (ref 4.0–10.5)
nRBC: 0 % (ref 0.0–0.2)

## 2022-04-06 LAB — POC URINE PREG, ED: Preg Test, Ur: NEGATIVE

## 2022-04-06 NOTE — ED Triage Notes (Signed)
Chest pain x 1 hr. Sharp in nature, radiating into left arm. Intermittent sob, pain worsens with deep inhalation. Denies N/V

## 2022-04-06 NOTE — ED Notes (Signed)
Urine sent to the lab at this time.  

## 2022-04-07 ENCOUNTER — Emergency Department
Admission: EM | Admit: 2022-04-07 | Discharge: 2022-04-07 | Discharge status: Against Medical Advice | Payer: 59 | Attending: Emergency Medicine | Admitting: Emergency Medicine

## 2022-04-07 LAB — BASIC METABOLIC PANEL
Anion gap: 6 (ref 5–15)
BUN: 18 mg/dL (ref 6–20)
CO2: 23 mmol/L (ref 22–32)
Calcium: 8.7 mg/dL — ABNORMAL LOW (ref 8.9–10.3)
Chloride: 110 mmol/L (ref 98–111)
Creatinine, Ser: 0.89 mg/dL (ref 0.44–1.00)
GFR, Estimated: >60 mL/min (ref >60)
Glucose, Bld: 101 mg/dL — ABNORMAL HIGH (ref 70–99)
Potassium: 3.6 mmol/L (ref 3.5–5.1)
Sodium: 139 mmol/L (ref 135–145)

## 2022-04-07 LAB — TROPONIN I (HIGH SENSITIVITY): Troponin I (High Sensitivity): <2 ng/L (ref <18)
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
## 2023-05-03 ENCOUNTER — Ambulatory Visit
Admission: EM | Admit: 2023-05-03 | Discharge: 2023-05-03 | Disposition: A | Discharge status: Home / Self Care | Payer: BLUE CROSS/BLUE SHIELD | Attending: Emergency Medicine | Admitting: Emergency Medicine

## 2023-05-03 DIAGNOSIS — B349 Viral infection, unspecified: Secondary | ICD-10-CM

## 2023-05-03 DIAGNOSIS — U071 COVID-19: Secondary | ICD-10-CM | POA: Diagnosis not present

## 2023-05-03 DIAGNOSIS — R509 Fever, unspecified: Secondary | ICD-10-CM | POA: Diagnosis present

## 2023-05-03 DIAGNOSIS — R519 Headache, unspecified: Secondary | ICD-10-CM | POA: Diagnosis present

## 2023-05-03 MED ORDER — IBUPROFEN 600 MG PO TABS: 600.0000 mg | ORAL_TABLET | Freq: Four times a day (QID) | ORAL | 0 refills | Status: AC | PRN

## 2023-05-03 NOTE — Discharge Instructions (Addendum)
Your COVID test is pending.    Take ibuprofen as needed for fever or discomfort.  Rest and keep yourself hydrated.    Follow-up with your primary care provider if your symptoms are not improving.

## 2023-05-03 NOTE — ED Provider Notes (Signed)
Donna Barker    CSN: [id number] Arrival date & time: 05/03/23  1659      History   Chief Complaint Chief Complaint  Patient presents with   Fever   Generalized Body Aches   Headache    HPI Donna Barker is a 37 y.o. female.  Patient presents with 2-day history of fever, chills, body aches, headache, sore throat, cough.  Treating with Mucinex.  No chest pain, shortness of breath, or other symptoms.  She denies pertinent medical history.     The history is provided by the patient and medical records.    History reviewed. No pertinent past medical history.  Patient Active Problem List   Diagnosis Date Noted   Bilateral carpal tunnel syndrome 01/31/2020   Lower urinary tract infectious disease 12/07/2019   Constipation 12/07/2019   Lumbar back pain 04/19/2019   Myofascial pain 04/19/2019   Fibroid 03/30/2019   Chest pain, atypical 08/18/2018    History reviewed. No pertinent surgical history.  OB History   No obstetric history on file.      Home Medications    Prior to Admission medications   Medication Sig Start Date End Date Taking? Authorizing Provider  ibuprofen (ADVIL) 600 MG tablet Take 1 tablet (600 mg total) by mouth every 6 (six) hours as needed. 05/03/23  Yes Mickie Bail, NP  ACETAMINOPHEN EXTRA STRENGTH 500 MG tablet PLEASE SEE ATTACHED FOR DETAILED DIRECTIONS Patient not taking: Reported on 05/03/2023 08/21/19   [provider]  Acetaminophen-Codeine 300-30 MG tablet TAKE 1 TABLET BY MOUTH EVERY 4 6 HOURS AS NEEDED FOR PAIN Patient not taking: Reported on 05/03/2023 05/10/19   [provider]  albuterol (VENTOLIN HFA) 108 (90 Base) MCG/ACT inhaler Inhale into the lungs. 02/06/20   [provider]  amoxicillin (AMOXIL) 500 MG tablet Take 500 mg by mouth 3 (three) times daily. Patient not taking: Reported on 05/03/2023 08/26/20   [provider]  EPINEPHrine 0.3 mg/0.3 mL IJ SOAJ injection  02/06/20   [provider]  fluconazole (DIFLUCAN) 150 MG tablet  07/23/20   [provider]  methocarbamol (ROBAXIN) 750 MG tablet Take by mouth. Patient not taking: Reported on 05/03/2023 04/19/19   [provider]  metroNIDAZOLE (FLAGYL) 500 MG tablet Take 500 mg by mouth 2 (two) times daily. Patient not taking: Reported on 05/03/2023 05/17/20   [provider]  mupirocin ointment (BACTROBAN) 2 % Apply 1 application topically 3 (three) times daily. 07/23/20   [provider]  naproxen (NAPROSYN) 500 MG tablet Take 500 mg by mouth 2 (two) times daily. 03/27/20   [provider]  omeprazole (PRILOSEC) 40 MG capsule Take 40 mg by mouth daily. 12/08/19   [provider]  oxyCODONE-acetaminophen (PERCOCET) 5-325 MG tablet Take 2 tablets by mouth every 6 (six) hours as needed for severe pain. Patient not taking: Reported on 05/03/2023 10/13/19   Loleta Rose, MD  penicillin v potassium (VEETID) 500 MG tablet Take 500 mg by mouth 4 (four) times daily. Patient not taking: Reported on 05/03/2023 08/24/20   [provider]    Family History History reviewed. No pertinent family history.  Social History Social History   Tobacco Use   Smoking status: Some Days    Types: Cigarettes   Smokeless tobacco: Never  Vaping Use   Vaping status: Never Used  Substance Use Topics   Alcohol use: Never   Drug use: Never     Allergies   Bee venom and  Tape   Review of Systems Review of Systems  Constitutional:  Positive for fever. Negative for chills.  HENT:  Positive for sore throat. Negative for ear pain.   Respiratory:  Positive for cough. Negative for shortness of breath.   Cardiovascular:  Negative for chest pain and palpitations.  Gastrointestinal:  Negative for diarrhea and vomiting.     Physical Exam Triage Vital Signs ED Triage Vitals  Encounter Vitals Group     BP      Systolic BP Percentile      Diastolic BP Percentile      Pulse       Resp      Temp      Temp src      SpO2      Weight      Height      Head Circumference      Peak Flow      Pain Score      Pain Loc      Pain Education      Exclude from Growth Chart    No data found.  Updated Vital Signs BP 90/69   Pulse 97   Temp 99.9 F (37.7 C)   Resp 18   LMP 04/25/2023   SpO2 98%   Visual Acuity Right Eye Distance:   Left Eye Distance:   Bilateral Distance:    Right Eye Near:   Left Eye Near:    Bilateral Near:     Physical Exam Vitals and nursing note reviewed.  Constitutional:      General: She is not in acute distress.    Appearance: She is well-developed.  HENT:     Right Ear: Tympanic membrane normal.     Left Ear: Tympanic membrane normal.     Nose: Nose normal.     Mouth/Throat:     Mouth: Mucous membranes are moist.     Pharynx: Oropharynx is clear.  Cardiovascular:     Rate and Rhythm: Normal rate and regular rhythm.     Heart sounds: Normal heart sounds.  Pulmonary:     Effort: Pulmonary effort is normal. No respiratory distress.     Breath sounds: Normal breath sounds.  Musculoskeletal:     Cervical back: Neck supple.  Skin:    General: Skin is warm and dry.  Neurological:     Mental Status: She is alert.      UC Treatments / Results  Labs (all labs ordered are listed, but only abnormal results are displayed) Labs Reviewed  SARS CORONAVIRUS 2 (TAT 6-24 HRS)    EKG   Radiology No results found.  Procedures Procedures (including critical care time)  Medications Ordered in UC Medications - No data to display  Initial Impression / Assessment and Plan / UC Course  I have reviewed the triage vital signs and the nursing notes.  Pertinent labs & imaging results that were available during my care of the patient were reviewed by me and considered in my medical decision making (see chart for details).    Viral illness.  COVID pending.  If COVID test is positive, recommend symptomatic treatment as patient  denies pertinent medical history.  Discussed symptomatic treatment including ibuprofen, rest, hydration.  Instructed patient to follow up with PCP if symptoms are not improving.  She agrees to plan of care.   Final Clinical Impressions(s) / UC Diagnoses   Final diagnoses:  Viral illness     Discharge Instructions      Your  COVID test is pending.    Take ibuprofen as needed for fever or discomfort.  Rest and keep yourself hydrated.    Follow-up with your primary care provider if your symptoms are not improving.         ED Prescriptions     Medication Sig Dispense Auth. Provider   ibuprofen (ADVIL) 600 MG tablet Take 1 tablet (600 mg total) by mouth every 6 (six) hours as needed. 30 tablet Mickie Bail, NP      PDMP not reviewed this encounter.   Mickie Bail, NP 05/03/23 1733

## 2023-05-03 NOTE — ED Triage Notes (Addendum)
Patient to Urgent Care with complaints of fevers/ chills/ generalized body aches/ headaches. Productive cough w/ green and yellow mucus production.  Symptoms started Saturday. Worsened over night.   Max temp 102. Taking Mucinex.

## 2023-05-04 LAB — SARS CORONAVIRUS 2 (TAT 6-24 HRS): SARS Coronavirus 2: POSITIVE — AB
